# Patient Record
Sex: Male | Born: 1950
Health system: Southern US, Community
[De-identification: ages and names within clinical notes are randomized; demographics above are authoritative.]

## PROBLEM LIST (undated history)

## (undated) DIAGNOSIS — K469 Unspecified abdominal hernia without obstruction or gangrene: Secondary | ICD-10-CM

## (undated) DIAGNOSIS — R55 Syncope and collapse: Secondary | ICD-10-CM

## (undated) DIAGNOSIS — Z973 Presence of spectacles and contact lenses: Secondary | ICD-10-CM

## (undated) DIAGNOSIS — R001 Bradycardia, unspecified: Secondary | ICD-10-CM

## (undated) HISTORY — DX: Bradycardia, unspecified: R00.1

## (undated) HISTORY — DX: Unspecified abdominal hernia without obstruction or gangrene: K46.9

## (undated) HISTORY — PX: CHOLECYSTECTOMY: SHX55

## (undated) HISTORY — DX: Syncope and collapse: R55

## (undated) HISTORY — DX: Presence of spectacles and contact lenses: Z97.3

---

## 2010-07-05 ENCOUNTER — Inpatient Hospital Stay (HOSPITAL_COMMUNITY): Admission: EM | Admit: 2010-07-05 | Discharge: 2010-07-08 | Payer: Self-pay | Source: Home / Self Care

## 2010-07-05 LAB — COMPREHENSIVE METABOLIC PANEL
ALT: 23 U/L (ref 0–53)
AST: 26 U/L (ref 0–37)
Alkaline Phosphatase: 96 U/L (ref 39–117)
CO2: 29 mEq/L (ref 19–32)
Calcium: 9.1 mg/dL (ref 8.4–10.5)
Chloride: 100 mEq/L (ref 96–112)
GFR calc Af Amer: >60 mL/min (ref >60)
GFR calc non Af Amer: >60 mL/min (ref >60)
Glucose, Bld: 88 mg/dL (ref 70–99)
Potassium: 3.3 mEq/L — ABNORMAL LOW (ref 3.5–5.1)
Sodium: 139 mEq/L (ref 135–145)
Total Bilirubin: 1.4 mg/dL — ABNORMAL HIGH (ref 0.3–1.2)

## 2010-07-05 LAB — CBC
HCT: 49.1 % (ref 39.0–52.0)
Hemoglobin: 17.1 g/dL — ABNORMAL HIGH (ref 13.0–17.0)
MCHC: 34.8 g/dL (ref 30.0–36.0)
RBC: 5.62 MIL/uL (ref 4.22–5.81)

## 2010-07-06 LAB — COMPREHENSIVE METABOLIC PANEL
ALT: 44 U/L (ref 0–53)
Albumin: 2.4 g/dL — ABNORMAL LOW (ref 3.5–5.2)
Alkaline Phosphatase: 66 U/L (ref 39–117)
Calcium: 8 mg/dL — ABNORMAL LOW (ref 8.4–10.5)
Glucose, Bld: 102 mg/dL — ABNORMAL HIGH (ref 70–99)
Potassium: 4.1 mEq/L (ref 3.5–5.1)
Sodium: 139 mEq/L (ref 135–145)
Total Protein: 5.6 g/dL — ABNORMAL LOW (ref 6.0–8.3)

## 2010-07-06 LAB — CBC
HCT: 37.3 % — ABNORMAL LOW (ref 39.0–52.0)
MCHC: 34.3 g/dL (ref 30.0–36.0)
Platelets: 191 10*3/uL (ref 150–400)
RDW: 12.5 % (ref 11.5–15.5)
WBC: 6.8 10*3/uL (ref 4.0–10.5)

## 2010-07-08 LAB — CBC
HCT: 36.6 % — ABNORMAL LOW (ref 39.0–52.0)
MCH: 29.9 pg (ref 26.0–34.0)
MCV: 89.1 fL (ref 78.0–100.0)
Platelets: 208 10*3/uL (ref 150–400)
RBC: 4.11 MIL/uL — ABNORMAL LOW (ref 4.22–5.81)
RDW: 12.5 % (ref 11.5–15.5)
WBC: 4.4 10*3/uL (ref 4.0–10.5)

## 2010-07-08 LAB — COMPREHENSIVE METABOLIC PANEL
Alkaline Phosphatase: 66 U/L (ref 39–117)
BUN: 4 mg/dL — ABNORMAL LOW (ref 6–23)
Chloride: 108 mEq/L (ref 96–112)
Creatinine, Ser: 1.02 mg/dL (ref 0.4–1.5)
Glucose, Bld: 99 mg/dL (ref 70–99)
Potassium: 4.4 mEq/L (ref 3.5–5.1)
Total Bilirubin: 0.8 mg/dL (ref 0.3–1.2)

## 2010-07-08 NOTE — H&P (Signed)
Warren Brown, Warren Brown              ACCOUNT NO.:  [id number]  MEDICAL RECORD NO.:  [id number]          PATIENT TYPE:  INP  LOCATION:  0105                         FACILITY:  [facility]  PHYSICIAN:  Juanetta Gosling, MDDATE OF BIRTH:  1950/10/14  DATE OF ADMISSION:  07/05/2010 DATE OF DISCHARGE:                             HISTORY & PHYSICAL   PRIMARY CARE PHYSICIAN:  Robert L. Foy Guadalajara, M.D.  CHIEF COMPLAINTS:  Abdominal pain with anorexia.  HISTORY OF PRESENT ILLNESS:  Warren Brown is a healthy, 60 year old, white male who has had several episodes of right upper quadrant abdominal pain over the last several months.  Usually these episodes resolve after about an hour or so.  This past Sunday the patient developed right upper quadrant epigastric pain again.  He states this was severe in nature and radiated up into his chest.  At that time, he went to an urgent care doctor who found that the patient had a slightly elevated white blood cell count and a low grade fever but gave him a GI cocktail with the anticipation that this may be some reflux.  This did not help the patient.  He was given a dose of IM antibiotics at that time, however, I am unsure which antibiotics these are.  None of this seemed to help the patient.  Over the next several days, the patient continued with anorexia.  His pain dulled some; however, it persisted as far as abdominal bloating and discomfort.  He went to see his primary care physician who ordered an ultrasound of the abdomen yesterday.  This revealed a 2.3-cm gallstone lodged in the neck of the gallbladder and was immobile.  Otherwise no pericholecystic fluid or evidence of acute cholecystitis.  His primary care physician called our office today, and at that time, we sent the patient over to the emergency department for further evaluation pending possible cholecystectomy.  REVIEW OF SYSTEMS:  Please see HPI, otherwise all other systems have been reviewed and  are negative.  FAMILY HISTORY:  Noncontributory.  PAST MEDICAL HISTORY:  None.  PAST SURGICAL HISTORY:  None.  SOCIAL HISTORY:  The patient is married with four children and two of which are stepchildren.  He denies any tobacco; however, he admits to one to two beers a week.  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  ALLERGIES:  NKDA.  MEDICATIONS:  None.  PHYSICAL EXAM:  GENERAL:  Warren Brown is a very pleasant, 60 year old, white male who is currently sitting up in a chair in no acute distress. VITAL SIGNS:  Have currently not been obtained. HEENT: Head is normocephalic, atraumatic.  Sclerae noninjected.  Pupils are equal, round and reactive to light.  Ears and nose without any obvious masses or lesions.  No rhinorrhea.  Mouth is pink.  Throat shows no exudate. HEART:  Regular rate and rhythm.  Normal S1-S2.  No murmurs, gallops or rubs are noted.  He does have palpable carotid, radial and pedal pulses bilaterally. LUNGS:  Clear to auscultation bilaterally with no wheezes, rhonchi or rales noted.  Respiratory effort is nonlabored. ABDOMEN:  Soft but tender  in the epigastrium and right upper quadrant. He does otherwise have active bowel sounds and is mildly distended.  No obvious masses, or organomegaly are noted.  He does have a reducible umbilical hernia noted.  MUSCULOSKELETAL: All four extremities are symmetrical with no cyanosis, clubbing or edema. NEUROLOGIC:  Cranial nerves II-XII appear to be grossly intact.  Deep tendon reflex exam is deferred at this time. PSYCHIATRIC:  The patient is alert x3 with an appropriate affect.  LABS AND DIAGNOSTICS:  Labs are currently pending, CBC and CMET.  DIAGNOSTICS: An ultrasound of the abdomen and pelvis reveals cholelithiasis along with a 2.3-cm stone lodged in the neck of the gallbladder.  This is nonmobile.  No evidence of gallbladder wall thickening or pericholecystic fluid is noted.  Common  bile duct is normal caliber.  IMPRESSION:  Symptomatic cholelithiasis.  PLAN:  At this time, we will get the patient admitted.  We will check a stat CBC and CMET.  If for some reason the patient's LFTs are significantly elevated and there is possibility of choledocholithiasis, then we will get the patient admitted and recheck labs in the morning for possible GI consultation.  However, if his labs remained relatively normal, we will plan on proceeding with a laparoscopic cholecystectomy today.  The patient will be placed on IV fluids as well as IV antibiotics on-call to the OR and p.r.n. medications as needed for pain and nausea.     Letha Cape, PA   ______________________________ Juanetta Gosling, MD    KEO/MEDQ  D:  07/05/2010  T:  07/05/2010  Job:  [id number]  cc:   Molly Maduro L. Foy Guadalajara, M.D. Fax: [phone]  Electronically Signed by Barnetta Chapel PA on 07/08/2010 01:18:58 PM Electronically Signed by Emelia Loron MD on 07/08/2010 04:28:37 PM

## 2010-07-08 NOTE — Op Note (Signed)
Warren Brown, Warren Brown              ACCOUNT NO.:  [id number]  MEDICAL RECORD NO.:  [id number]          PATIENT TYPE:  INP  LOCATION:  1514                         FACILITY:  [facility]  PHYSICIAN:  Juanetta Gosling, MDDATE OF BIRTH:  December 07, 1950  DATE OF PROCEDURE:  07/05/2010 DATE OF DISCHARGE:                              OPERATIVE REPORT   PREOPERATIVE DIAGNOSIS:  Acute cholecystitis.  POSTOPERATIVE DIAGNOSIS:  Acute cholecystitis.  PROCEDURE:  Laparoscopic cholecystectomy.  SURGEON:  Juanetta Gosling, MD.  FIRST ASSISTANT:  Adolph Pollack, M.D.  ANESTHESIA:  General.  ANESTHESIOLOGIST:  Jill Side, M.D.  ESTIMATED BLOOD LOSS:  100 mL.  COMPLICATIONS:  None.  DRAINS:  A 9-French Blake drain to right upper quadrant.  COMPLICATIONS:  None.  DISPOSITION:  To recovery room in stable condition.  SPECIMENS:  Gallbladder contents to pathology.  SPECIMENS:  This is a 60 year old male who presents with about 5- to 6- day history of right upper quadrant and epigastric pain.  On his ultrasound, he had 2.3-cm gallbladder stone lodged in the neck of his gallbladder.  His bilirubin was only mildly elevated at 1.4.  The remainder of his LFTs are normal.  He had clinically had gallbladder disease both by history and by his examination.  He then discussed the laparoscopic cholecystectomy with risks and benefits associated with that.  His wife was present for this conversation as well.  DESCRIPTION OF PROCEDURE:  After informed consent was obtained, the patient was taken to the operating room.  He was administered 3 g of intravenous Unasyn and sequential compression devices were placed in lower extremities prior to induction of anesthesia.  He was then placed under general endotracheal anesthesia without complications.  His abdomen was prepped and draped in a standard sterile surgical fashion. Surgical time-out was then performed.  I infiltrated 0.25% Marcaine  below his umbilicus and made an 11-blade incision.  He had a small umbilical hernia, so was not to go through this and repair this and we can do this at a later date as I think he will get mesh for this but I would not repair this but mesh at this time.  I then entered his fascia with 11-blade.  The peritoneum was entered bluntly.  A 0 Vicryl pursestring suture was placed in the fascia.  A Hasson trocar was then introduced.  The abdomen was then insufflated to 15 mmHg pressure.  Three 4- to 5-mm ports were placed in the epigastrium and right upper quadrant.  The gallbladder was noted to be intrahepatic and had lot of adhesions to it from the omentum as well as the duodenum.  These were all taken down with a combination of blunt dissection and cautery.  The gallbladder also was aspirated in an attempt to be able to grasp but little bit easier, it was very thickened and edematous.  He clearly had had some chronic inflammation along with acute inflammation that he had.  This was a very difficult gallbladder to remove. Then eventually was able to push a lot of the inflammation down and was able to identify the triangle of Calot.  It took about  40 minutes dissecting the triangle of Calot.  Eventually, I was able to identify the cystic artery, the both anterior and posterior branches as well as the cystic duct.  I photographed and performed a cholangiogram but this cystic duct made me pretty sure that I did not want to do a cholangiogram on this.  I did obtain the critical view of safety however and I clipped the artery and clipped the duct and divided both of these. With some difficulty,  I removed the gallbladder off the liver bed. It was very adherent and there was no real plane between the 2. Eventually, I removed the gallbladder and I placed an EndoCatch bag and removed it from the umbilicus. I then obtained hemostasis after quite a while on his liver bed as it was bleeding a fair amount.   I also irrigated copiously.  I then placed a 19-French Harrison Mons drain and brought out it via my port sites.  This was secured with an 2-0 nylon.  I irrigated until this was clear.  I did place 2 pieces of Surgicel snow in his liver bed.  I then removed my umbilical trocar, washed this area, get tied down.  The umbilical defect was obliterated.  There is no evidence of any entry injury.  I then removed all the trocars, desufflated the abdomen, and I closed them with 4-0 Monocryl.  All the incisions then closed with 4-0  Monocryl and Dermabond was placed over these.  He tolerated this well and was transferred to recovery room in stable condition.     Juanetta Gosling, MD     MCW/MEDQ  D:  07/05/2010  T:  07/05/2010  Job:  [id number]  Electronically Signed by Emelia Loron MD on 07/08/2010 11:31:34 AM

## 2010-07-19 NOTE — Discharge Summary (Signed)
Warren Brown, Warren Brown              ACCOUNT NO.:  [id number]  MEDICAL RECORD NO.:  [id number]          PATIENT TYPE:  INP  LOCATION:  1514                         FACILITY:  [facility]  PHYSICIAN:  Wilmon Arms. Corliss Skains, M.D. DATE OF BIRTH:  1951-01-21  DATE OF ADMISSION:  07/05/2010 DATE OF DISCHARGE:  07/08/2010                              DISCHARGE SUMMARY   PRIMARY CARE PHYSICIAN:  Robert L. Foy Guadalajara, M.D.  CONSULTANTS:  None.  PROCEDURES:  Laparoscopic cholecystectomy by Dr. Dwain Sarna on July 05, 2010.  REASON FOR ADMISSION:  Warren Brown is a healthy 60 year old male who has had several prior episodes of right upper quadrant abdominal pain. About a week prior to admission, he began having severe right upper quadrant and epigastric and chest pain.  This eventually subsided, but continued to have a dull pain in the right upper quadrant as well as bloatedness.  He had had some anorexia.  He eventually had an ultrasound, which revealed a 2.3-cm gallstone lodged in the neck of the gallbladder.  Due to continued anorexia and discomfort, he presented to the emergency department.  We were asked to see him for surgical admission.  ADMITTING DIAGNOSIS:  Symptomatic cholelithiasis.  HOSPITAL COURSE:  At this time, the patient was admitted.  He was placed on IV fluids.  He was given a dose of Unasyn on call to the operating room.  He was later taken to the operating room, where he was found to have acute cholecystitis.  A laparoscopic cholecystectomy was completed. However, due to a significant amount of inflammation, a 19-French Blake drain was placed in the right upper quadrant.  Postoperatively, the patient did fairly well.  On the first postoperative day, the patient had some issues with urinary retention.  Therefore, he had several I and O caths completed.  He continued to have some abdominal pain on postoperative day #1 as well as decreased appetite.  He was left on clear liquids.  On  postoperative day #2, his diet was advanced to a regular diet.  His JP drain was only putting out serosanguineous stuff. On postoperative day #3, the patient's pain was much improved.  His abdomen was soft and minimally tender.  His JP drain continued to remain serosanguineous and without any evidence of bile; therefore, this was discontinued prior to discharge home.  At this time, the patient was felt stable for discharge home.  DISCHARGE DIAGNOSES: 1. Acute cholecystitis with cholelithiasis. 2. Status post laparoscopic cholecystectomy. 3. Reducible umbilical hernia. 4. Urinary retention, resolved.  DISCHARGE MEDICATIONS:  Please see medication reconciliation form.  DISCHARGE INSTRUCTIONS:  The patient may increase his activities slowly and walk up steps.  He may shower; however, he is not to bathe.  He is not to do any heavy lifting over 15 pounds for the next 2 weeks.  He is not to drive up to 1 week depending on how he feels.  He has no dietary restrictions.  He is to return to see Dr. Emelia Loron in our office in 2-3 weeks to also discuss elective umbilical hernia repair as well.  He is to otherwise call our office for fever greater  than 101.5 or worsening abdominal pain.     Letha Cape, PA   ______________________________ Wilmon Arms. Corliss Skains, M.D.    KEO/MEDQ  D:  07/08/2010  T:  07/08/2010  Job:  [id number]  cc:   Molly Maduro L. Foy Guadalajara, M.D. Fax: [phone]  Juanetta Gosling, MD [address] [place] [address]  Electronically Signed by Barnetta Chapel PA on 07/17/2010 03:29:31 PM Electronically Signed by Manus Rudd M.D. on 07/19/2010 04:08:45 PM

## 2010-11-18 ENCOUNTER — Ambulatory Visit (HOSPITAL_COMMUNITY)
Admission: RE | Admit: 2010-11-18 | Discharge: 2010-11-18 | Disposition: A | Discharge status: Home / Self Care | Payer: BC Managed Care – PPO | Source: Ambulatory Visit | Attending: General Surgery | Admitting: General Surgery

## 2010-11-18 ENCOUNTER — Other Ambulatory Visit (INDEPENDENT_AMBULATORY_CARE_PROVIDER_SITE_OTHER): Payer: Self-pay | Admitting: General Surgery

## 2010-11-18 ENCOUNTER — Encounter (HOSPITAL_COMMUNITY): Payer: BC Managed Care – PPO

## 2010-11-18 DIAGNOSIS — Z01818 Encounter for other preprocedural examination: Secondary | ICD-10-CM

## 2010-11-18 DIAGNOSIS — K439 Ventral hernia without obstruction or gangrene: Secondary | ICD-10-CM | POA: Insufficient documentation

## 2010-11-18 DIAGNOSIS — Z01812 Encounter for preprocedural laboratory examination: Secondary | ICD-10-CM | POA: Insufficient documentation

## 2010-11-18 LAB — DIFFERENTIAL
Basophils Absolute: 0 10*3/uL (ref 0.0–0.1)
Basophils Relative: 0 % (ref 0–1)
Lymphocytes Relative: 17 % (ref 12–46)
Neutro Abs: 4.3 10*3/uL (ref 1.7–7.7)
Neutrophils Relative %: 73 % (ref 43–77)

## 2010-11-18 LAB — CBC
HCT: 45.1 % (ref 39.0–52.0)
Hemoglobin: 15 g/dL (ref 13.0–17.0)
RBC: 5.16 MIL/uL (ref 4.22–5.81)
WBC: 5.9 10*3/uL (ref 4.0–10.5)

## 2010-11-18 LAB — BASIC METABOLIC PANEL
CO2: 28 mEq/L (ref 19–32)
Chloride: 107 mEq/L (ref 96–112)
GFR calc non Af Amer: >60 mL/min (ref >60)
Glucose, Bld: 86 mg/dL (ref 70–99)
Potassium: 3.9 mEq/L (ref 3.5–5.1)
Sodium: 142 mEq/L (ref 135–145)

## 2010-11-18 LAB — SURGICAL PCR SCREEN: Staphylococcus aureus: POSITIVE — AB

## 2010-11-29 ENCOUNTER — Observation Stay (HOSPITAL_COMMUNITY)
Admission: RE | Admit: 2010-11-29 | Discharge: 2010-11-30 | DRG: 160 | Disposition: A | Discharge status: Home / Self Care | Payer: BC Managed Care – PPO | Source: Ambulatory Visit | Attending: General Surgery | Admitting: General Surgery

## 2010-11-29 DIAGNOSIS — Z9089 Acquired absence of other organs: Secondary | ICD-10-CM | POA: Insufficient documentation

## 2010-11-29 DIAGNOSIS — Z01818 Encounter for other preprocedural examination: Secondary | ICD-10-CM | POA: Insufficient documentation

## 2010-11-29 DIAGNOSIS — K429 Umbilical hernia without obstruction or gangrene: Principal | ICD-10-CM | POA: Insufficient documentation

## 2010-11-29 DIAGNOSIS — Z01812 Encounter for preprocedural laboratory examination: Secondary | ICD-10-CM | POA: Insufficient documentation

## 2010-11-29 HISTORY — PX: LAPAROSCOPIC INCISIONAL / UMBILICAL / VENTRAL HERNIA REPAIR: SUR789

## 2010-11-29 NOTE — Op Note (Signed)
NAMEOTHMAR, RINGER NO.:  [id number]  MEDICAL RECORD NO.:  [id number]  LOCATION:  DAYL                         FACILITY:  [facility]  PHYSICIAN:  Juanetta Gosling, MDDATE OF BIRTH:  30-Dec-1950  DATE OF PROCEDURE: DATE OF DISCHARGE:                              OPERATIVE REPORT   PREOPERATIVE DIAGNOSIS:  Umbilical hernia.  POSTOPERATIVE DIAGNOSIS:  Umbilical hernia.  PROCEDURE:  Laparoscopic ventral hernia repair with 10 x 15 cm Physiomesh.  SURGEON:  Juanetta Gosling, MD  ASSISTANT:  None.  ANESTHESIA:  General.  SUPERVISING ANESTHESIOLOGIST:  Hezzie Bump. Okey Dupre, M.D.  SPECIMENS:  None.  DRAINS:  None.  COMPLICATIONS:  None.  ESTIMATED BLOOD LOSS:  Minimal.  DISPOSITION:  Recovery room in stable condition.  INDICATIONS:  This is a 60 year old male who I operated on for a gangrenous gallbladder on July 05, 2010 which he did well from, except for some postoperative urinary retention.  He had an umbilical hernia at that time but I elected not to repair this because it was a size that needed mesh to repair and I told him that we would repair this at a later date.  He came back and to see me this area.  He still had about a 1.5 to 2-cm hernia.  I discussed with him the options.  I think that the best option given the fact that he has a laparoscopic incision right around that area as well would be to do a laparoscopic repair and cover this hernia as well as that area with mesh to reduce the long-term risk of recurrence.  He and I did discuss the risks at length, the risk and benefits associated with them and decided to proceed.  PROCEDURE:  After informed consent was obtained, the patient was taken to the operating room.  He was administered 1 g of intravenous cefazolin.  Sequential compression devices were placed on lower extremities prior to induction with anesthesia.  He was then placed under general anesthesia without complication.  A Foley  catheter was placed.  His abdomen was prepped and draped in standard sterile surgical fashion.  His arms were tucked and appropriately padded.  Collier Flowers was placed over his abdomen and surgical time-out was then performed.  An orogastric tube was placed and the stomach was evacuated.  I then infiltrated 0.25% Marcaine in his left upper quadrant and made an incision with an 11 blade.  I then entered his abdomen without any difficulty with a 5-mm OptiVu trocar.  The abdomen was then insufflated to 15 mmHg pressure.  I then inspected for an entry injury, of which there was none.  He had about a 2-cm umbilical hernia.  With the laparoscopic incision just below that, I then placed a 10-mm trocar in his left midabdomen as well as left lower quadrant as well as one on the right side with another 5-mm trocar.  I then used a 10 x 15 cm mesh after attaching 0 Prolene sutures to the 4 cardinal positions, I inserted this through the 10-mm trocar.  I then laid this flat.  I got about 5-cm coverage in all directions with this mesh and went below his laparoscopic incision as well.  I pulled these up at the [facility] device and secured this with the Ethicon secure strap tacker as well. This mesh was in good position and gave good coverage to the hernia throughout.  There was no evidence of any injury anywhere after inspecting the bowel.  I then pulled the Hasson trocar out.  I closed this with a 0 Vicryl suture in the Endoclose device and then desufflated the abdomen and removed all trocars.  The incisions were closed with 4-0 Monocryl and Dermabond and abdominal binder was placed.  He tolerated this well.  His Foley catheter is going to remain overnight due to his history of urinary retention.  He was extubated in the operating room and transferred to recovery room in stable condition.  Sponge and needle counts were correct at completion of operation.     Juanetta Gosling, MD     MCW/MEDQ  D:   11/29/2010  T:  11/29/2010  Job:  [id number]  cc:   Molly Maduro L. Foy Guadalajara, M.D. Fax: [phone]  Electronically Signed by Emelia Loron MD on 11/29/2010 03:42:41 PM

## 2010-12-13 ENCOUNTER — Encounter (INDEPENDENT_AMBULATORY_CARE_PROVIDER_SITE_OTHER): Payer: Self-pay | Admitting: General Surgery

## 2010-12-16 ENCOUNTER — Encounter (INDEPENDENT_AMBULATORY_CARE_PROVIDER_SITE_OTHER): Payer: BC Managed Care – PPO | Admitting: General Surgery

## 2010-12-21 NOTE — Discharge Summary (Signed)
  NAMEELBERT, Warren Brown              ACCOUNT NO.:  [id number]  MEDICAL RECORD NO.:  [id number]  LOCATION:  1537                         FACILITY:  [facility]  PHYSICIAN:  Juanetta Gosling, MDDATE OF BIRTH:  1950/10/21  DATE OF ADMISSION:  11/29/2010 DATE OF DISCHARGE:  11/30/2010                              DISCHARGE SUMMARY   ADMISSION DIAGNOSES: 1. Umbilical hernia. 2. Status post lap chole.  POSTOPERATIVE DIAGNOSES: 1. Umbilical hernia. 2. Status post lap chole.  PROCEDURES PERFORMED:  November 29, 2010, laparoscopic ventral hernia repair with 10 x 15 cm Physiomesh.  CONSULTATIONS:  None.  HISTORY AND HOSPITAL COURSE:  This is a 61 year old male I did a laparoscopic cholecystectomy on July 05, 2010, for acute cholecystitis.  He had an umbilical hernia at that time, it was a size where he needed to have mesh placed, so I did not address it at the same time due to the fact that he had infected gallbladder.  He then returned to see me for a symptomatic umbilical hernia.  We discussed all the options and decided on the laparoscopic repair.  He underwent this uneventfully on November 29, 2010.  Postoperative day #1, he was on oral pain medications, had Foley removed, was voiding, was tolerating regular diet and was discharged to home.  PERTINENT LABORATORY EVALUATION:  None.  PERTINENT RADIOLOGIC INFORMATION:  None.  FOLLOW-UP:  With Dr. Dwain Sarna in 3 weeks.  DISCHARGE INSTRUCTIONS:  Restrictions were no heavy lifting over 10 to 15 pounds until he returns to see me.     Juanetta Gosling, MD     MCW/MEDQ  D:  12/20/2010  T:  12/21/2010  Job:  [id number]  Electronically Signed by Emelia Loron MD on 12/21/2010 06:57:42 PM

## 2011-01-13 ENCOUNTER — Ambulatory Visit (INDEPENDENT_AMBULATORY_CARE_PROVIDER_SITE_OTHER): Payer: BC Managed Care – PPO | Admitting: General Surgery

## 2011-01-13 ENCOUNTER — Encounter (INDEPENDENT_AMBULATORY_CARE_PROVIDER_SITE_OTHER): Payer: Self-pay | Admitting: General Surgery

## 2011-01-13 DIAGNOSIS — K439 Ventral hernia without obstruction or gangrene: Secondary | ICD-10-CM | POA: Insufficient documentation

## 2011-01-13 DIAGNOSIS — Z09 Encounter for follow-up examination after completed treatment for conditions other than malignant neoplasm: Secondary | ICD-10-CM

## 2011-01-13 NOTE — Progress Notes (Signed)
Subjective:     Patient ID: Warren Brown, male   DOB: [birth year], 60 y.o.   MRN: [id number]  HPI This is a 60 year old male who I know from a laparoscopic cholecystectomy for acute cholecystitis. He had an umbilical hernia at that time that I did not repair. She came back and we ended up performing a laparoscopic ventral hernia repair with physiomesh. He stayed in the hospital overnight and was then discharged home. He's done well overall. He had some issues with constipation that are now resolved. He also complains of some intermittent left lower quadrant pain at his port sites as well as his suture sites it is improving over some time now.  Review of Systems     Objective:   Physical Exam Abdomen soft, nontender, incisions all clean without infection, no seroma     Assessment:     S/p LVH repair    Plan:        I released to full activity today and asked him to come back and see me as needed.  I told him he could begin lifting weights exercising slowly and workup back to his normal state that he was in prior to the operation.

## 2014-10-30 DIAGNOSIS — Z0000 Encounter for general adult medical examination without abnormal findings: Secondary | ICD-10-CM | POA: Diagnosis not present

## 2014-10-30 DIAGNOSIS — R748 Abnormal levels of other serum enzymes: Secondary | ICD-10-CM | POA: Diagnosis not present

## 2014-10-30 DIAGNOSIS — Z23 Encounter for immunization: Secondary | ICD-10-CM | POA: Diagnosis not present

## 2014-10-30 DIAGNOSIS — Z136 Encounter for screening for cardiovascular disorders: Secondary | ICD-10-CM | POA: Diagnosis not present

## 2014-10-30 DIAGNOSIS — Z125 Encounter for screening for malignant neoplasm of prostate: Secondary | ICD-10-CM | POA: Diagnosis not present

## 2015-01-14 DIAGNOSIS — Z743 Need for continuous supervision: Secondary | ICD-10-CM | POA: Diagnosis not present

## 2015-01-14 DIAGNOSIS — R55 Syncope and collapse: Secondary | ICD-10-CM | POA: Diagnosis not present

## 2015-01-15 DIAGNOSIS — R55 Syncope and collapse: Secondary | ICD-10-CM | POA: Diagnosis not present

## 2015-01-15 DIAGNOSIS — R079 Chest pain, unspecified: Secondary | ICD-10-CM | POA: Diagnosis not present

## 2015-01-17 DIAGNOSIS — R030 Elevated blood-pressure reading, without diagnosis of hypertension: Secondary | ICD-10-CM | POA: Diagnosis not present

## 2015-01-17 DIAGNOSIS — R55 Syncope and collapse: Secondary | ICD-10-CM | POA: Diagnosis not present

## 2015-01-17 DIAGNOSIS — R944 Abnormal results of kidney function studies: Secondary | ICD-10-CM | POA: Diagnosis not present

## 2015-01-23 ENCOUNTER — Ambulatory Visit (INDEPENDENT_AMBULATORY_CARE_PROVIDER_SITE_OTHER): Payer: Medicare Other | Admitting: Cardiovascular Disease

## 2015-01-23 ENCOUNTER — Encounter: Payer: Self-pay | Admitting: Cardiovascular Disease

## 2015-01-23 VITALS — BP 138/90 | HR 54 | Ht 70.0 in | Wt 172.0 lb

## 2015-01-23 DIAGNOSIS — R55 Syncope and collapse: Secondary | ICD-10-CM

## 2015-01-23 NOTE — Progress Notes (Signed)
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 He was referred by Warren Brown at [facility] for cardiovascular evaluation because of a recent episode of syncope. He has no coronary vascular risk factors. He had his presyncopal episodes in quick succession in [place] on 01/15/15. This occurred after driving 12 hours from [place] to [place] without food or water. He did drink 4 beers that evening and had 3 sequential episodes of syncope. There is no loss of bowel or bladder control. When EMS arrived his blood pressure was noted to be 90/50. Recent blood work performed 2 days afterwards revealed a serum creatinine 1.75 which was significantly higher than his baseline of 1. He denies chest pain or shortness of breath.   No current outpatient prescriptions on file.   No current facility-administered medications for this visit.    Allergies  Allergen Reactions  . Neosporin [Neomycin-Polymyxin-Gramicidin] Rash    Social History   Social History  . Marital Status: Married    Spouse Name: N/A  . Number of Children: N/A  . Years of Education: N/A   Occupational History  . Not on file.   Social History Main Topics  . Smoking status: Never Smoker   . Smokeless tobacco: Not on file  . Alcohol Use: 0.5 oz/week    1 drink(s) per week  . Drug Use: Not on file  . Sexual Activity: Not on file   Other Topics Concern  . Not on file   Social History Narrative     Review of Systems: General: negative for chills, fever, night sweats or weight changes.  Cardiovascular: negative for chest pain, dyspnea on exertion, edema, orthopnea, palpitations, paroxysmal nocturnal dyspnea or  shortness of breath Dermatological: negative for rash Respiratory: negative for cough or wheezing Urologic: negative for hematuria Abdominal: negative for nausea, vomiting, diarrhea, bright red blood per rectum, melena, or hematemesis Neurologic: negative for visual changes, syncope, or dizziness All other systems reviewed and are otherwise negative except as noted above.    Blood pressure 138/90, pulse 54, height  (1.778 m), weight 172 lb (78.019 kg).  General appearance: alert and no distress Neck: no adenopathy, no carotid bruit, no JVD, supple, symmetrical, trachea midline and thyroid not enlarged, symmetric, no tenderness/mass/nodules Lungs: clear to auscultation bilaterally Heart: regular rate and rhythm, S1, S2 normal, no murmur, click, rub or gallop Extremities: extremities normal, atraumatic, no cyanosis or edema  EKG sinus bradycardia at 54 without ST or T-wave changes. I personally reviewed this EKG  ASSESSMENT AND PLAN:   Syncope Warren Brown was  referred for evaluation of syncope. He is a 64 year old fit-appearing married Caucasian male with no critically factors who drove from [place] to [place] where he was visiting his daughter and son for 12 hours. During the trip he did not eat or drink anything. That night he drank 4 beers and had 3 syncopal episodes. When EMS arrived his blood pressure was 90/50. This occurred on 01/15/15. His most recent lab work on 01/17/15 showed an increase in his creatinine from 1-1.75. He denies chest pain or shortness of breath. He never had an episode like this before. His EKG just does show sinus bradycardia rate of 54. I suspect his  increasing creatinine was from ATN related to dehydration and I suspect this will resolve. I'm going to get a 2 week event monitor to assess his rate and rhythm. I will see him back in 3 months for follow-up.      Warren Gess MD FACP,FACC,FAHA, [facility] 01/23/2015 4:25 PM

## 2015-01-23 NOTE — Assessment & Plan Note (Signed)
Mr. Mohl was  referred for evaluation of syncope. He is a 64 year old fit-appearing married Caucasian male with no critically factors who drove from [place] to [place] where he was visiting his daughter and son for 12 hours. During the trip he did not eat or drink anything. That night he drank 4 beers and had 3 syncopal episodes. When EMS arrived his blood pressure was 90/50. This occurred on 01/15/15. His most recent lab work on 01/17/15 showed an increase in his creatinine from 1-1.75. He denies chest pain or shortness of breath. He never had an episode like this before. His EKG just does show sinus bradycardia rate of 54. I suspect his increasing creatinine was from ATN related to dehydration and I suspect this will resolve. I'm going to get a 2 week event monitor to assess his rate and rhythm. I will see him back in 3 months for follow-up.

## 2015-01-23 NOTE — Patient Instructions (Signed)
Dr Allyson Sabal recommends that you schedule a follow-up appointment in 3 months.  Your Doctor has ordered you to wear a heart monitor. You will wear this for 2 weeks.   TIPS -  REMINDERS 1. The sensor is the lanyard that is worn around your neck every day - this is powered by a battery that needs to be changed every day 2. The monitor is the device that allows you to record symptoms - this will need to be charged daily 3. The sensor & monitor need to be within 100 feet of each other at all times 4. The sensor connects to the electrodes (stickers) - these should be changed every 24-48 hours (you do not have to remove them when you bathe, just make sure they are dry when you connect it back to the sensor 5. If you need more supplies (electrodes, batteries), please call the 1-800 # on the back of the pamphlet and [employer] will mail you more supplies 6. If your skin becomes sensitive, please try the sample pack of sensitive skin electrodes (the white packet in your silver box) and call [employer] to have them mail you more of these type of electrodes 7. When you are finish wearing the monitor, please place all supplies back in the silver box, place the silver box in the pre-packaged UPS bag and drop off at [employer] or call them so they can come pick it up   Cardiac Event Monitoring A cardiac event monitor is a small recording device used to help detect abnormal heart rhythms (arrhythmias). The monitor is used to record heart rhythm when noticeable symptoms such as the following occur:  Fast heartbeats (palpitations), such as heart racing or fluttering.  Dizziness.  Fainting or light-headedness.  Unexplained weakness. The monitor is wired to two electrodes placed on your chest. Electrodes are flat, sticky disks that attach to your skin. The monitor can be worn for up to 30 days. You will wear the monitor at all times, except when bathing.  HOW TO USE YOUR CARDIAC EVENT MONITOR A technician will prepare  your chest for the electrode placement. The technician will show you how to place the electrodes, how to work the monitor, and how to replace the batteries. Take time to practice using the monitor before you leave the office. Make sure you understand how to send the information from the monitor to your health care provider. This requires a telephone with a landline, not a cell phone. You need to:  Wear your monitor at all times, except when you are in water:  Do not get the monitor wet.  Take the monitor off when bathing. Do not swim or use a hot tub with it on.  Keep your skin clean. Do not put body lotion or moisturizer on your chest.  Change the electrodes daily or any time they stop sticking to your skin. You might need to use tape to keep them on.  It is possible that your skin under the electrodes could become irritated. To keep this from happening, try to put the electrodes in slightly different places on your chest. However, they must remain in the area under your left breast and in the upper right section of your chest.  Make sure the monitor is safely clipped to your clothing or in a location close to your body that your health care provider recommends.  Press the button to record when you feel symptoms of heart trouble, such as dizziness, weakness, light-headedness, palpitations, thumping, shortness of  breath, unexplained weakness, or a fluttering or racing heart. The monitor is always on and records what happened slightly before you pressed the button, so do not worry about being too late to get good information.  Keep a diary of your activities, such as walking, doing chores, and taking medicine. It is especially important to note what you were doing when you pushed the button to record your symptoms. This will help your health care provider determine what might be contributing to your symptoms. The information stored in your monitor will be reviewed by your health care provider  alongside your diary entries.  Send the recorded information as recommended by your health care provider. It is important to understand that it will take some time for your health care provider to process the results.  Change the batteries as recommended by your health care provider. SEEK IMMEDIATE MEDICAL CARE IF:   You have chest pain.  You have extreme difficulty breathing or shortness of breath.  You develop a very fast heartbeat that persists.  You develop dizziness that does not go away.  You faint or constantly feel you are about to faint. Document Released: 03/04/2008 Document Revised: 10/10/2013 Document Reviewed: 11/22/2012 [facility] Patient Information 2015 [place], [place]. This information is not intended to replace advice given to you by your health care provider. Make sure you discuss any questions you have with your health care provider.

## 2015-01-25 ENCOUNTER — Encounter (INDEPENDENT_AMBULATORY_CARE_PROVIDER_SITE_OTHER): Payer: Medicare Other

## 2015-01-25 DIAGNOSIS — R55 Syncope and collapse: Secondary | ICD-10-CM | POA: Diagnosis not present

## 2015-01-31 ENCOUNTER — Telehealth: Payer: Self-pay | Admitting: Cardiovascular Disease

## 2015-01-31 NOTE — Telephone Encounter (Signed)
Patient states that he had a problem with his monitor and the company will have a new one out in 2 days.  He wanted to ask Dr. Allyson Sabal if it was really necessary for him to wear a monitor for another week?  Could he look what he has at what his monitor has done for the past week?  Also wants to let Dr. Allyson Sabal know that his blood pressure has stabilized this week.  8/23: 125/83  8/24: 120/79  Wanted to let Dr. Allyson Sabal know that he feels pretty good

## 2015-01-31 NOTE — Telephone Encounter (Signed)
Warren Brown is calling about his heat monitor . He has some questions about it . Please call   Thanks

## 2015-02-02 NOTE — Telephone Encounter (Signed)
Spoke to caller & advised to continue to wear monitor. Pt voiced opposition to this and wants to discontune wear. 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  Advised if insistent to discontinue, contact [employer] so they can arrange - reiterated recommendation to continue. Pt voiced understanding.

## 2015-02-02 NOTE — Telephone Encounter (Signed)
Cell phone goes directly to VM. LM for patient to return call.

## 2015-02-02 NOTE — Telephone Encounter (Signed)
Patient is still waiting to hear from [facility]

## 2015-02-02 NOTE — Telephone Encounter (Signed)
Pt called in stating that he is returning the nurse's call

## 2015-02-02 NOTE — Telephone Encounter (Signed)
Phone still goes directly to voicemail. Left msg for patient to call, gave desk extension to reach me.

## 2015-02-19 DIAGNOSIS — R944 Abnormal results of kidney function studies: Secondary | ICD-10-CM | POA: Diagnosis not present

## 2015-02-19 DIAGNOSIS — R55 Syncope and collapse: Secondary | ICD-10-CM | POA: Diagnosis not present

## 2015-03-07 ENCOUNTER — Ambulatory Visit: Payer: BC Managed Care – PPO | Admitting: Cardiology

## 2015-03-12 DIAGNOSIS — Z23 Encounter for immunization: Secondary | ICD-10-CM | POA: Diagnosis not present

## 2015-03-12 DIAGNOSIS — R001 Bradycardia, unspecified: Secondary | ICD-10-CM | POA: Diagnosis not present

## 2015-03-12 DIAGNOSIS — I471 Supraventricular tachycardia: Secondary | ICD-10-CM | POA: Diagnosis not present

## 2015-04-25 ENCOUNTER — Ambulatory Visit: Payer: Medicare Other | Admitting: Cardiovascular Disease

## 2016-09-16 ENCOUNTER — Emergency Department (HOSPITAL_COMMUNITY): Payer: Medicare Other

## 2016-09-16 ENCOUNTER — Encounter (HOSPITAL_COMMUNITY): Payer: Self-pay | Admitting: Emergency Medicine

## 2016-09-16 ENCOUNTER — Emergency Department (HOSPITAL_COMMUNITY)
Admission: EM | Admit: 2016-09-16 | Discharge: 2016-09-16 | Disposition: A | Discharge status: Home / Self Care | Payer: Medicare Other | Attending: Emergency Medicine | Admitting: Emergency Medicine

## 2016-09-16 DIAGNOSIS — R55 Syncope and collapse: Secondary | ICD-10-CM

## 2016-09-16 DIAGNOSIS — R001 Bradycardia, unspecified: Secondary | ICD-10-CM | POA: Insufficient documentation

## 2016-09-16 DIAGNOSIS — R112 Nausea with vomiting, unspecified: Secondary | ICD-10-CM | POA: Diagnosis not present

## 2016-09-16 LAB — BASIC METABOLIC PANEL
Anion gap: 10 (ref 5–15)
BUN: 11 mg/dL (ref 6–20)
CALCIUM: 8.7 mg/dL — AB (ref 8.9–10.3)
CO2: 26 mmol/L (ref 22–32)
CREATININE: 1.05 mg/dL (ref 0.61–1.24)
Chloride: 104 mmol/L (ref 101–111)
GFR calc Af Amer: >60 mL/min (ref >60)
GLUCOSE: 121 mg/dL — AB (ref 65–99)
POTASSIUM: 3.6 mmol/L (ref 3.5–5.1)
Sodium: 140 mmol/L (ref 135–145)

## 2016-09-16 LAB — CBC
HCT: 46.4 % (ref 39.0–52.0)
Hemoglobin: 15.7 g/dL (ref 13.0–17.0)
MCH: 30.8 pg (ref 26.0–34.0)
MCHC: 33.8 g/dL (ref 30.0–36.0)
MCV: 91.2 fL (ref 78.0–100.0)
PLATELETS: 170 10*3/uL (ref 150–400)
RBC: 5.09 MIL/uL (ref 4.22–5.81)
RDW: 12.9 % (ref 11.5–15.5)
WBC: 12.1 10*3/uL — AB (ref 4.0–10.5)

## 2016-09-16 LAB — MAGNESIUM: MAGNESIUM: 2.1 mg/dL (ref 1.7–2.4)

## 2016-09-16 LAB — CBG MONITORING, ED: GLUCOSE-CAPILLARY: 125 mg/dL — AB (ref 65–99)

## 2016-09-16 LAB — I-STAT TROPONIN, ED: TROPONIN I, POC: 0 ng/mL (ref 0.00–0.08)

## 2016-09-16 MED ORDER — SODIUM CHLORIDE 0.9 % IV BOLUS (SEPSIS)
1000.0000 mL | Freq: Once | INTRAVENOUS | Status: AC
Start: 2016-09-16 — End: 2016-09-16
  Administered 2016-09-16: 1000 mL via INTRAVENOUS

## 2016-09-16 MED ORDER — ONDANSETRON 4 MG PO TBDP: 4.0000 mg | ORAL_TABLET | Freq: Three times a day (TID) | ORAL | 0 refills | Status: DC | PRN

## 2016-09-16 MED ORDER — ONDANSETRON HCL 4 MG/2ML IJ SOLN
INTRAMUSCULAR | Status: AC
  Filled 2016-09-16: qty 2

## 2016-09-16 MED ORDER — ONDANSETRON HCL 4 MG/2ML IJ SOLN: 4.0000 mg | Freq: Once | INTRAMUSCULAR | Status: DC

## 2016-09-16 NOTE — ED Triage Notes (Signed)
Pt arrives via EMS from parking lot for syncopal episode, pt has no recollection of event. States nauseated at this time, states "I felt pretty strange" and nausea p/t episode. Hx bradycardia. Wife at bedside reports he hit his head but did not witness event.

## 2016-09-16 NOTE — ED Notes (Signed)
Pt vomited large amount, but continues to refuse Zofran.  Pt st's he is fine now and is ready to go home.  Advised pt and explained that he needed to stay in the hospital

## 2016-09-16 NOTE — ED Provider Notes (Signed)
[facility]-EMERGENCY DEPT Provider Note   CSN: [id number] Arrival date & time: 09/16/16  1950     History   Chief Complaint Chief Complaint  Patient presents with  . Loss of Consciousness    HPI Warren Brown is a 66 y.o. male.  The history is provided by the patient.  Loss of Consciousness   This is a new problem. The current episode started less than 1 hour ago. The problem occurs constantly. The problem has been resolved. He lost consciousness for a period of less than one minute. The problem is associated with normal activity. Associated symptoms include light-headedness and nausea. Pertinent negatives include abdominal pain, back pain, bladder incontinence, bowel incontinence, chest pain, clumsiness, confusion, congestion, diaphoresis, dizziness, fever, focal sensory loss, focal weakness, headaches, malaise/fatigue, palpitations, seizures, slurred speech, vertigo, visual change, vomiting and weakness. He has tried nothing for the symptoms. His past medical history does not include CAD, CVA, HTN or seizures.    Past Medical History:  Diagnosis Date  . Hernia   . Sinus bradycardia   . Syncope   . Wears glasses     Patient Active Problem List   Diagnosis Date Noted  . Syncope 01/23/2015  . Ventral hernia 01/13/2011    Past Surgical History:  Procedure Laterality Date  . CHOLECYSTECTOMY    . LAPAROSCOPIC INCISIONAL / UMBILICAL / VENTRAL HERNIA REPAIR  11/29/10       Home Medications    Prior to Admission medications   Medication Sig Start Date End Date Taking? Authorizing Provider  ondansetron (ZOFRAN ODT) 4 MG disintegrating tablet Take 1 tablet (4 mg total) by mouth every 8 (eight) hours as needed for nausea or vomiting. 09/16/16   Stacy Gardner, MD    Family History Family History  Problem Relation Age of Onset  . Hypertension Mother     Social History Social History  Substance Use Topics  . Smoking status: Never Smoker  . Smokeless tobacco: Never Used  .  Alcohol use 0.5 oz/week    1 Standard drinks or equivalent per week     Allergies   Neosporin [neomycin-polymyxin-gramicidin]   Review of Systems Review of Systems  Constitutional: Negative for chills, diaphoresis, fever and malaise/fatigue.  HENT: Negative for congestion, ear pain and sore throat.   Eyes: Negative for pain and visual disturbance.  Respiratory: Negative for cough and shortness of breath.   Cardiovascular: Positive for syncope. Negative for chest pain and palpitations.  Gastrointestinal: Positive for nausea. Negative for abdominal pain, bowel incontinence and vomiting.  Genitourinary: Negative for bladder incontinence, dysuria and hematuria.  Musculoskeletal: Negative for arthralgias and back pain.  Skin: Negative for color change and rash.  Neurological: Positive for syncope and light-headedness. Negative for dizziness, vertigo, focal weakness, seizures, weakness and headaches.  Psychiatric/Behavioral: Negative for confusion.  All other systems reviewed and are negative.    Physical Exam Updated Vital Signs BP 132/75   Pulse (!) 52   Temp 97.8 F (36.6 C) (Oral)   Resp (!) 9   Ht  (1.778 m)   Wt 79.4 kg   SpO2 93%   BMI 25.11 kg/m   Physical Exam  Constitutional: He appears well-developed and well-nourished.  HENT:  Head: Normocephalic and atraumatic.  Mouth/Throat: Mucous membranes are normal.  Eyes: Conjunctivae and EOM are normal. Pupils are equal, round, and reactive to light.  Neck: Neck supple.  Cardiovascular: Regular rhythm and intact distal pulses.  Bradycardia present.   No murmur heard. Pulmonary/Chest: Effort normal and breath  sounds normal. No respiratory distress.  Abdominal: Soft. There is no tenderness.  Musculoskeletal: He exhibits no edema.  Neurological: He is alert. He has normal strength. No cranial nerve deficit or sensory deficit. Coordination normal. GCS eye subscore is 4. GCS verbal subscore is 5. GCS motor subscore is  6.  Skin: Skin is warm and dry.  Psychiatric: He has a normal mood and affect. His speech is normal and behavior is normal.  Nursing note and vitals reviewed.    ED Treatments / Results  Labs (all labs ordered are listed, but only abnormal results are displayed) Labs Reviewed  BASIC METABOLIC PANEL - Abnormal; Notable for the following:       Result Value   Glucose, Bld 121 (*)    Calcium 8.7 (*)    All other components within normal limits  CBC - Abnormal; Notable for the following:    WBC 12.1 (*)    All other components within normal limits  CBG MONITORING, ED - Abnormal; Notable for the following:    Glucose-Capillary 125 (*)    All other components within normal limits  MAGNESIUM  URINALYSIS, ROUTINE W REFLEX MICROSCOPIC  I-STAT TROPOININ, ED  I-STAT TROPOININ, ED    EKG  EKG Interpretation  Date/Time:  Tuesday September 16 2016 19:56:42 EDT Ventricular Rate:  51 PR Interval:    QRS Duration: 102 QT Interval:  490 QTC Calculation: 452 R Axis:   80 Text Interpretation:  Sinus bradycardia T wave abnormality Artifact Abnormal ekg Confirmed by Gerhard Munch  MD ([id number]) on 09/16/2016 8:01:32 PM       Radiology Dg Chest 2 View  Result Date: 09/16/2016 CLINICAL DATA:  Syncope and bradycardia. EXAM: CHEST  2 VIEW COMPARISON:  PA and lateral chest 11/18/2010. FINDINGS: The lungs are clear. Heart size is normal. No pneumothorax or pleural fluid. No bony abnormality. IMPRESSION: Negative chest. Electronically Signed   By: Drusilla Kanner M.D.   On: 09/16/2016 20:41   Ct Head Wo Contrast  Result Date: 09/16/2016 CLINICAL DATA:  Initial evaluation for acute syncope, bradycardia. EXAM: CT HEAD WITHOUT CONTRAST TECHNIQUE: Contiguous axial images were obtained from the base of the skull through the vertex without intravenous contrast. COMPARISON:  None. FINDINGS: Brain: Cerebral volume within normal limits for patient age. No evidence for acute intracranial hemorrhage. No  findings to suggest acute large vessel territory infarct. No mass lesion, midline shift, or mass effect. Ventricles are normal in size without evidence for hydrocephalus. No extra-axial fluid collection identified. Vascular: No hyperdense vessel identified.Scattered vascular calcifications noted within the carotid siphons. Scattered foci of gas within it cavernous sinus likely related IV access. Skull: Scalp soft tissues demonstrate no acute abnormality.Calvarium intact. Sinuses/Orbits: Globes and orbital soft tissues are within normal limits. Mild scattered mucosal thickening within the ethmoidal air cells. Paranasal sinuses are otherwise clear. No mastoid effusion. IMPRESSION: Negative head CT.  No acute intracranial process identified. Electronically Signed   By: Rise Mu M.D.   On: 09/16/2016 21:16    Procedures Procedures (including critical care time)  Medications Ordered in ED Medications  ondansetron [facility]) injection 4 mg (4 mg Intravenous Refused 09/16/16 2009)  sodium chloride 0.9 % bolus 1,000 mL (1,000 mLs Intravenous New Bag/Given 09/16/16 2008)     Initial Impression / Assessment and Plan / ED Course  I have reviewed the triage vital signs and the nursing notes.  Pertinent labs & imaging results that were available during my care of the patient were reviewed by me and  considered in my medical decision making (see chart for details).     65 year old male with previous history of bradycardia presents in the setting of syncopal event. Per wife and patient he was getting out of his car to walk into restaurant when he felt lightheaded and nauseated in the next thing the patient knew he awoke on the ground. Bystander reported to see patient at syncopal event falling striking back of head. Patient without injury to head and no bleeding on scene. Patient stable in route with EMS and glucose within normal limits.  On arrival patient was hemodynamically stable and afebrile.  Patient neurovascularly intact on examination. No tongue biting or urinary incontinence. No post ictal period noted. Patient without history of seizures. Patient complaining of mild nausea and EKG revealed sinus bradycardia with no signs of acute ischemia, ST segment elevation or depression. Initial troponin not elevated and no electrolyte abnormality is noted. No signs of anemia. Chest x-ray without significant abnormalities and CT head revealed no intracranial abnormality specifically no signs of hemorrhage. Patient had one further bout of emesis in the emergency department and otherwise without complaints. Patient continues to have bradycardia on monitor.  Have low suspicion for ACS, PE, ruptured AAA or other emergent condition at this time.  I had extensive informed discussion with patient and believe patient would be better served to be admitted to hospital for further cardiac monitoring and likely cardiology evaluation in the morning. Concerned that the syncope could be related to vasovagal syncopal event versus symptomatically bradycardia versus other arrhythmia. However patient reports he has pre-existing cardiologist as well as close primary care follow-up and he would like to be discharged home with outpatient management of this condition. After extensive discussion and in agreement that patient is capable of making his own medical decisions and is safe for this discharge plan at home. Patient and wife given strict return caution the patient reports he will call his primary care physician first thing in the morning. Patient was stable at time of discharge and in agreement with plan.  Final Clinical Impressions(s) / ED Diagnoses   Final diagnoses:  Syncope and collapse  Non-intractable vomiting with nausea, unspecified vomiting type  Bradycardia    New Prescriptions New Prescriptions   ONDANSETRON (ZOFRAN ODT) 4 MG DISINTEGRATING TABLET    Take 1 tablet (4 mg total) by mouth every 8 (eight)  hours as needed for nausea or vomiting.     Stacy Gardner, MD 09/16/16 2245    Gerhard Munch, MD 09/16/16 [phone]

## 2016-09-26 ENCOUNTER — Encounter: Payer: Self-pay | Admitting: Cardiovascular Disease

## 2016-09-26 ENCOUNTER — Ambulatory Visit (INDEPENDENT_AMBULATORY_CARE_PROVIDER_SITE_OTHER): Payer: Medicare Other | Admitting: Cardiovascular Disease

## 2016-09-26 VITALS — BP 148/86 | HR 53 | Ht 70.0 in | Wt 178.0 lb

## 2016-09-26 DIAGNOSIS — R55 Syncope and collapse: Secondary | ICD-10-CM | POA: Diagnosis not present

## 2016-09-26 NOTE — Assessment & Plan Note (Signed)
Warren Brown returns because a recent episode of syncope on 4/10. He had several episodes of loss of consciousness 2 years ago. He describes symptoms of nausea and vomiting and abdominal pain. I suspect his loss of consciousness was vagal. He went to the ER for evaluation. His EKG showed sinus bradycardia 51. I'm going to get a 2-D echo and a one-month event monitor and will see him back in 6 months unless there is anything that requires further evaluation. We did talk about not driving for 6 months.

## 2016-09-26 NOTE — Progress Notes (Signed)
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 He was referred by Dr. Foy Brown at [facility] for cardiovascular evaluation because of a recent episode of syncope. I last saw him in the office 01/23/15 He has no coronary vascular risk factors. He had his presyncopal episodes in quick succession in [place] on 01/15/15. This occurred after driving 12 hours from [place] to [place] without food or water. He did drink 4 beers that evening and had 3 sequential episodes of syncope. There is no loss of bowel or bladder control. When EMS arrived his blood pressure was noted to be 90/50. Recent blood work performed 2 days afterwards revealed a serum creatinine 1.75 which was significantly higher than his baseline of 1. He denies chest pain or shortness of breath. He recently had a syncopal episode on 09/16/16 which was probably vagal. It was preceded by nausea vomiting and abdominal pain. His EKG in the ER showed sinus bradycardia. He did fall and hit his head and was worked up for this. He is recovering from a concussion.   No current outpatient prescriptions on file.   No current facility-administered medications for this visit.     Allergies  Allergen Reactions  . Neosporin [Neomycin-Polymyxin-Gramicidin] Rash    Social History   Social History  . Marital status: Married    Spouse name: N/A  . Number of children: 2  . Years of education: N/A   Occupational History  . Not on file.   Social History Main Topics  . Smoking status: Never Smoker  . Smokeless tobacco: Never Used  . Alcohol use 0.5 oz/week    1 Standard drinks or equivalent per week  . Drug use: No  . Sexual activity:  Not on file   Other Topics Concern  . Not on file   Social History Narrative  . No narrative on file     Review of Systems: General: negative for chills, fever, night sweats or weight changes.  Cardiovascular: negative for chest pain, dyspnea on exertion, edema, orthopnea, palpitations, paroxysmal nocturnal dyspnea or shortness of breath Dermatological: negative for rash Respiratory: negative for cough or wheezing Urologic: negative for hematuria Abdominal: negative for nausea, vomiting, diarrhea, bright red blood per rectum, melena, or hematemesis Neurologic: negative for visual changes, syncope, or dizziness All other systems reviewed and are otherwise negative except as noted above.    Blood pressure (!) 148/86, pulse (!) 53, height  (1.778 m), weight 178 lb (80.7 kg).  General appearance: alert and no distress Neck: no adenopathy, no carotid bruit, no JVD, supple, symmetrical, trachea midline and thyroid not enlarged, symmetric, no tenderness/mass/nodules Lungs: clear to auscultation bilaterally Heart: regular rate and rhythm, S1, S2 normal, no murmur, click, rub or gallop Extremities: extremities normal, atraumatic, no cyanosis or edema  EKG not performed today  ASSESSMENT AND PLAN:   Syncope Warren Brown returns because a recent episode of syncope on 4/10. He had several episodes of loss of consciousness 2 years ago. He describes symptoms of nausea and vomiting and abdominal pain. I suspect his loss of consciousness was vagal. He went to the ER for evaluation. His EKG showed sinus bradycardia 51. I'm going to get a 2-D echo and  a one-month event monitor and will see him back in 6 months unless there is anything that requires further evaluation. We did talk about not driving for 6 months.      Warren Gess MD FACP,FACC,FAHA, [facility] 09/26/2016 11:53 AM

## 2016-09-26 NOTE — Patient Instructions (Addendum)
Medication Instructions: Your physician recommends that you continue on your current medications as directed. Please refer to the Current Medication list given to you today.   Testing/Procedures: Your physician has requested that you have an echocardiogram. Echocardiography is a painless test that uses sound waves to create images of your heart. It provides your doctor with information about the size and shape of your heart and how well your heart's chambers and valves are working. This procedure takes approximately one hour. There are no restrictions for this procedure.  Your physician has recommended that you wear a 30 day event monitor. Event monitors are medical devices that record the heart's electrical activity. Doctors most often [place] these monitors to diagnose arrhythmias. Arrhythmias are problems with the speed or rhythm of the heartbeat. The monitor is a small, portable device. You can wear one while you do your normal daily activities. This is usually used to diagnose what is causing palpitations/syncope (passing out).  Follow-Up: Your physician recommends that you schedule a follow-up appointment with Dr. Allyson Sabal in 6 months.  If you need a refill on your cardiac medications before your next appointment, please call your pharmacy.

## 2016-10-06 ENCOUNTER — Ambulatory Visit (INDEPENDENT_AMBULATORY_CARE_PROVIDER_SITE_OTHER): Payer: Medicare Other

## 2016-10-06 DIAGNOSIS — R55 Syncope and collapse: Secondary | ICD-10-CM | POA: Diagnosis not present

## 2016-10-09 ENCOUNTER — Encounter: Payer: Self-pay | Admitting: Cardiovascular Disease

## 2016-10-14 ENCOUNTER — Other Ambulatory Visit: Payer: Self-pay

## 2016-10-14 ENCOUNTER — Ambulatory Visit (HOSPITAL_COMMUNITY): Payer: Medicare Other | Attending: Cardiovascular Disease

## 2016-10-14 DIAGNOSIS — R55 Syncope and collapse: Secondary | ICD-10-CM | POA: Diagnosis present

## 2016-10-14 DIAGNOSIS — I517 Cardiomegaly: Secondary | ICD-10-CM | POA: Insufficient documentation

## 2016-10-24 ENCOUNTER — Encounter: Payer: Self-pay | Admitting: Cardiovascular Disease

## 2016-11-11 ENCOUNTER — Ambulatory Visit (INDEPENDENT_AMBULATORY_CARE_PROVIDER_SITE_OTHER): Payer: Medicare Other | Admitting: Cardiovascular Disease

## 2016-11-11 ENCOUNTER — Encounter: Payer: Self-pay | Admitting: Cardiovascular Disease

## 2016-11-11 DIAGNOSIS — R55 Syncope and collapse: Secondary | ICD-10-CM | POA: Diagnosis not present

## 2016-11-11 NOTE — Progress Notes (Signed)
Warren Brown returns today for follow-up of his event monitor for syncope. He does feel a syncopal episode that occurred recently was vagally mediated. Event monitor showed sinus rhythm, sinus bradycardia with 1 episode of AV dissociation. He said he is aware of the sugars for his vagal episode. His aware of not driving for 6 months from his syncopal episode. He is on no rate lowering drugs. 2-D echo was normal except for mildly dilated ascending aortic root and moderate left atrial dilatation. I will see him back in 6 months. 

## 2016-11-11 NOTE — Assessment & Plan Note (Signed)
Mr. Warren Brown returns today for follow-up of his event monitor for syncope. He does feel a syncopal episode that occurred recently was vagally mediated. Event monitor showed sinus rhythm, sinus bradycardia with 1 episode of AV dissociation. He said he is aware of the sugars for his vagal episode. His aware of not driving for 6 months from his syncopal episode. He is on no rate lowering drugs. 2-D echo was normal except for mildly dilated ascending aortic root and moderate left atrial dilatation. I will see him back in 6 months.

## 2016-11-11 NOTE — Patient Instructions (Signed)

## 2017-02-17 ENCOUNTER — Encounter: Payer: Self-pay | Admitting: Cardiovascular Disease

## 2017-02-17 ENCOUNTER — Ambulatory Visit (INDEPENDENT_AMBULATORY_CARE_PROVIDER_SITE_OTHER): Payer: Medicare Other | Admitting: Cardiovascular Disease

## 2017-02-17 DIAGNOSIS — R55 Syncope and collapse: Secondary | ICD-10-CM

## 2017-02-17 NOTE — Assessment & Plan Note (Signed)
History of syncope which occurred back in April probably vagally mediated. This was accompanied by nausea, vomiting or abdominal pain. In the ER he was bradycardic and hypotensive. Subsequent workup including 2-D echo was normal and event monitor showed sinus rhythm/sinus bradycardia. He's had no recurrent episodes.

## 2017-02-17 NOTE — Patient Instructions (Signed)
Medication Instructions: Your physician recommends that you continue on your current medications as directed. Please refer to the Current Medication list given to you today.   Follow-Up: Your physician recommends that you schedule a follow-up appointment as needed with Dr. Berry.    

## 2017-02-17 NOTE — Progress Notes (Signed)
02/17/2017 Warren PinaJoseph Stellmach   Feb 18, 1951  [id number]  Primary Physician Joycelyn RuaMeyers, Stephen, MD Primary Cardiologist: Runell GessJonathan J Berry MD Nicholes CalamityFACP, FACC, FAHA, [place]  HPI:  Warren Brown is a 66 y.o. 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 He was referred by Dr. Foy GuadalajaraFried at [facility] for cardiovascular evaluation because of a recent episode of syncope. I last saw him in the office 11/11/16 He has no coronary vascular risk factors. He had his presyncopal episodes in quick succession in [place] on 01/15/15. This occurred [place] without food or water. He did drink 4 beers that evening and had 3 sequential episodes of syncope. There is no loss of bowel or bladder control. When EMS arrived his blood pressure was noted to be 90/50. Recent blood work performed 2 days afterwards revealed a serum creatinine 1.75 which was significantly higher than his baseline of 1. He denies chest pain or shortness of breath. He recently had a syncopal episode on 09/16/16 which was probably vagal. It was preceded by nausea vomiting and abdominal pain. His EKG in the ER showed sinus bradycardia. He did fall and hit his head and was worked up for this. He has recovering from a concussion. Since I saw him in the office 3 months ago he's remained stable. He's had no further episodes of syncope which again sounded vagal.   No outpatient prescriptions have been marked as taking for the 02/17/17 encounter (Office Visit) with Runell GessBerry, Jonathan J, MD.     Allergies  Allergen Reactions  . Neosporin [Neomycin-Polymyxin-Gramicidin] Rash    Social History   Social History  . Marital status: Married    Spouse name: N/A  . Number of children: 2  . Years of education: N/A   Occupational History  . Not on file.   Social History Main Topics  . Smoking status: Never Smoker  .  Smokeless tobacco: Never Used  . Alcohol use 0.5 oz/week    1 Standard drinks or equivalent per week  . Drug use: No  . Sexual activity: Not on file   Other Topics Concern  . Not on file   Social History Narrative  . No narrative on file     Review of Systems: General: negative for chills, fever, night sweats or weight changes.  Cardiovascular: negative for chest pain, dyspnea on exertion, edema, orthopnea, palpitations, paroxysmal nocturnal dyspnea or shortness of breath Dermatological: negative for rash Respiratory: negative for cough or wheezing Urologic: negative for hematuria Abdominal: negative for nausea, vomiting, diarrhea, bright red blood per rectum, melena, or hematemesis Neurologic: negative for visual changes, syncope, or dizziness All other systems reviewed and are otherwise negative except as noted above.    Blood pressure (!) 146/78, pulse 64, height 5\' 10"  (1.778 m), weight 181 lb (82.1 kg).  General appearance: alert and no distress Neck: no adenopathy, no carotid bruit, no JVD, supple, symmetrical, trachea midline and thyroid not enlarged, symmetric, no tenderness/mass/nodules Lungs: clear to auscultation bilaterally Heart: regular rate and rhythm, S1, S2 normal, no murmur, click, rub or gallop Extremities: extremities normal, atraumatic, no cyanosis or edema  EKG performed today  ASSESSMENT AND PLAN:   Syncope History of syncope which occurred back in April probably vagally mediated. This was accompanied by nausea, vomiting or abdominal pain. In the ER he was bradycardic and hypotensive. Subsequent workup including 2-D echo was normal  and event monitor showed sinus rhythm/sinus bradycardia. He's had no recurrent episodes.      Runell Gess MD FACP,FACC,FAHA, [facility] 02/17/2017 8:32 AM

## 2018-03-30 ENCOUNTER — Encounter: Payer: Self-pay | Admitting: Cardiovascular Disease

## 2018-03-30 ENCOUNTER — Ambulatory Visit: Payer: Medicare Other | Admitting: Cardiovascular Disease

## 2018-03-30 VITALS — BP 134/86 | HR 48 | Ht 70.0 in | Wt 182.0 lb

## 2018-03-30 DIAGNOSIS — I517 Cardiomegaly: Secondary | ICD-10-CM | POA: Diagnosis not present

## 2018-03-30 DIAGNOSIS — I712 Thoracic aortic aneurysm, without rupture, unspecified: Secondary | ICD-10-CM

## 2018-03-30 NOTE — Patient Instructions (Signed)
Medication Instructions:  Your physician recommends that you continue on your current medications as directed. Please refer to the Current Medication list given to you today.  If you need a refill on your cardiac medications before your next appointment, please call your pharmacy.   Lab work: none If you have labs (blood work) drawn today and your tests are completely normal, you will receive your results only by: Marland Kitchen MyChart Message (if you have MyChart) OR . A paper copy in the mail If you have any lab test that is abnormal or we need to change your treatment, we will call you to review the results.  Testing/Procedures: Your physician has requested that you have an echocardiogram. Echocardiography is a painless test that uses sound waves to create images of your heart. It provides your doctor with information about the size and shape of your heart and how well your heart's chambers and valves are working. This procedure takes approximately one hour. There are no restrictions for this procedure.  Scheduled Oct. 2020  Follow-Up: At [facility], you and your health needs are our priority.  As part of our continuing mission to provide you with exceptional heart care, we have created designated Provider Care Teams.  These Care Teams include your primary Cardiologist (physician) and Advanced Practice Providers (APPs -  Physician Assistants and Nurse Practitioners) who all work together to provide you with the care you need, when you need it. You will need a follow up appointment in 2 years.  Please call our office 2 months in advance to schedule this appointment.  You may see Dr. Allyson Sabal or one of the following Advanced Practice Providers on your designated Care Team:   Corine Shelter, PA-C Judy Pimple, [place] . Marjie Skiff, PA-C  Any Other Special Instructions Will Be Listed Below (If Applicable).

## 2018-03-30 NOTE — Assessment & Plan Note (Signed)
His ascending thoracic aorta on 2D echo measured 40 mm.  We will follow this in 2 years by 2D echocardiography.

## 2018-03-30 NOTE — Progress Notes (Signed)
03/30/2018 Warren Brown   November 28, 1950  [id number]  Primary Physician Joycelyn Rua, MD Primary Cardiologist: Runell Gess MD Nicholes Calamity, [place]  HPI:  Warren Brown is a 67 y.o.  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 He was referred by Dr. Foy Guadalajara at [facility] for cardiovascular evaluation because of a recent episode of syncope. I last saw him in the office 02/17/2017.  He has no coronary vascular risk factors. He had his presyncopal episodes in quick succession in [place] on 01/15/15. This occurred after driving 12 hours from [place] to [place] without food or water. He did drink 4 beers that evening and had 3 sequential episodes of syncope. There is no loss of bowel or bladder control. When EMS arrived his blood pressure was noted to be 90/50. Recent blood work performed 2 days afterwards revealed a serum creatinine 1.75 which was significantly higher than his baseline of 1. He denies chest pain or shortness of breath. He recently had a syncopal episode on 09/16/16 which was probably vagal. It was preceded by nausea vomiting and abdominal pain. His EKG in the ER showed sinus bradycardia. He did fall and hit his head and was worked up for this. He is recovering from a concussion. Since I saw him a year ago he did obtain a Fitbit and follows his activity and heart rate closely.  He said no recurrent episodes.  He denies chest pain or shortness of breath.  Recent blood work performed by his PCP 10/07/2017 revealed total cholesterol 163, LDL of 80 and HDL of 71.   No outpatient medications have been marked as taking for the 03/30/18 encounter (Office Visit) with Runell Gess, MD.     Allergies  Allergen Reactions  . Neosporin [Neomycin-Polymyxin-Gramicidin] Rash    Social History   Socioeconomic History  . Marital status: Married    Spouse name: Not on file    . Number of children: 2  . Years of education: Not on file  . Highest education level: Not on file  Occupational History  . Not on file  Social Needs  . Financial resource strain: Not on file  . Food insecurity:    Worry: Not on file    Inability: Not on file  . Transportation needs:    Medical: Not on file    Non-medical: Not on file  Tobacco Use  . Smoking status: Never Smoker  . Smokeless tobacco: Never Used  Substance and Sexual Activity  . Alcohol use: Yes    Alcohol/week: 1.0 standard drinks    Types: 1 Standard drinks or equivalent per week  . Drug use: No  . Sexual activity: Not on file  Lifestyle  . Physical activity:    Days per week: Not on file    Minutes per session: Not on file  . Stress: Not on file  Relationships  . Social connections:    Talks on phone: Not on file    Gets together: Not on file    Attends religious service: Not on file    Active member of club or organization: Not on file    Attends meetings of clubs or organizations: Not on file    Relationship status: Not on file  . Intimate partner violence:    Fear of current or ex partner: Not on file    Emotionally abused: Not on file    Physically  abused: Not on file    Forced sexual activity: Not on file  Other Topics Concern  . Not on file  Social History Narrative  . Not on file     Review of Systems: General: negative for chills, fever, night sweats or weight changes.  Cardiovascular: negative for chest pain, dyspnea on exertion, edema, orthopnea, palpitations, paroxysmal nocturnal dyspnea or shortness of breath Dermatological: negative for rash Respiratory: negative for cough or wheezing Urologic: negative for hematuria Abdominal: negative for nausea, vomiting, diarrhea, bright red blood per rectum, melena, or hematemesis Neurologic: negative for visual changes, syncope, or dizziness All other systems reviewed and are otherwise negative except as noted above.    Blood pressure  134/86, pulse (!) 48, height 5\' 10"  (1.778 m), weight 182 lb (82.6 kg).  General appearance: alert and no distress Neck: no adenopathy, no carotid bruit, no JVD, supple, symmetrical, trachea midline and thyroid not enlarged, symmetric, no tenderness/mass/nodules Lungs: clear to auscultation bilaterally Heart: regular rate and rhythm, S1, S2 normal, no murmur, click, rub or gallop Extremities: extremities normal, atraumatic, no cyanosis or edema Pulses: 2+ and symmetric Skin: Skin color, texture, turgor normal. No rashes or lesions Neurologic: Alert and oriented X 3, normal strength and tone. Normal symmetric reflexes. Normal coordination and gait  EKG sinus bradycardia 48 without ST or T wave changes.  Personally reviewed this EKG.  ASSESSMENT AND PLAN:   Syncope History of syncope without episodes in the last 12 months.  He does wear a Fitbit and follows his activity and heart rate closely.  He did have a 2D echo that showed mild basal septal hypertrophy but otherwise was normal except for moderate left atrial dilatation and an event monitor that showed sinus bradycardia.  His average heart rate on his Fitbit is 49.  Thoracic aortic aneurysm (HCC) His ascending thoracic aorta on 2D echo measured 40 mm.  We will follow this in 2 years by 2D echocardiography.      Runell Gess MD FACP,FACC,FAHA, [facility] 03/30/2018 9:04 AM

## 2018-03-30 NOTE — Assessment & Plan Note (Signed)
History of syncope without episodes in the last 12 months.  He does wear a Fitbit and follows his activity and heart rate closely.  He did have a 2D echo that showed mild basal septal hypertrophy but otherwise was normal except for moderate left atrial dilatation and an event monitor that showed sinus bradycardia.  His average heart rate on his Fitbit is 49.

## 2019-07-24 ENCOUNTER — Ambulatory Visit: Payer: Medicare PPO | Attending: Internal Medicine

## 2019-07-24 DIAGNOSIS — Z23 Encounter for immunization: Secondary | ICD-10-CM | POA: Insufficient documentation

## 2019-07-24 NOTE — Progress Notes (Signed)
   Covid-19 Vaccination Clinic  Name:  Warren Brown    MRN: [id number] DOB: April 17, 1951  07/24/2019  Warren Brown was observed post Covid-19 immunization for 15 minutes without incidence. He was provided with Vaccine Information Sheet and instruction to access the V-Safe system.   Warren Brown was instructed to call 911 with any severe reactions post vaccine: Marland Kitchen Difficulty breathing  . Swelling of your face and throat  . A fast heartbeat  . A bad rash all over your body  . Dizziness and weakness    Immunizations Administered    Name Date Dose VIS Date Route   Pfizer COVID-19 Vaccine 07/24/2019  2:34 PM 0.3 mL 05/20/2019 Intramuscular   Manufacturer: [employer], [employer]   Lot: [id number]   NDC: [address]-1

## 2019-08-16 ENCOUNTER — Ambulatory Visit: Payer: Medicare PPO | Attending: Internal Medicine

## 2019-08-16 DIAGNOSIS — Z23 Encounter for immunization: Secondary | ICD-10-CM | POA: Insufficient documentation

## 2019-08-16 NOTE — Progress Notes (Signed)
   Covid-19 Vaccination Clinic  Name:  Belen Zwahlen    MRN: [id number] DOB: 1950-11-04  08/16/2019  Mr. Graumann was observed post Covid-19 immunization for 15 minutes without incident. He was provided with Vaccine Information Sheet and instruction to access the V-Safe system.   Mr. Glassco was instructed to call 911 with any severe reactions post vaccine: Marland Kitchen Difficulty breathing  . Swelling of face and throat  . A fast heartbeat  . A bad rash all over body  . Dizziness and weakness   Immunizations Administered    Name Date Dose VIS Date Route   Pfizer COVID-19 Vaccine 08/16/2019  2:20 PM 0.3 mL 05/20/2019 Intramuscular   Manufacturer: [employer], [employer]   Lot: [id number]   NDC: [address]-9

## 2019-08-17 ENCOUNTER — Ambulatory Visit: Payer: Medicare PPO

## 2019-10-25 ENCOUNTER — Other Ambulatory Visit: Payer: Self-pay

## 2019-10-25 ENCOUNTER — Encounter: Payer: Self-pay | Admitting: Cardiovascular Disease

## 2019-10-25 ENCOUNTER — Ambulatory Visit: Payer: Medicare PPO | Admitting: Cardiovascular Disease

## 2019-10-25 DIAGNOSIS — I712 Thoracic aortic aneurysm, without rupture, unspecified: Secondary | ICD-10-CM

## 2019-10-25 DIAGNOSIS — R001 Bradycardia, unspecified: Secondary | ICD-10-CM

## 2019-10-25 LAB — BASIC METABOLIC PANEL
BUN/Creatinine Ratio: 15 (ref 10–24)
BUN: 16 mg/dL (ref 8–27)
CO2: 26 mmol/L (ref 20–29)
Calcium: 9 mg/dL (ref 8.6–10.2)
Chloride: 104 mmol/L (ref 96–106)
Creatinine, Ser: 1.09 mg/dL (ref 0.76–1.27)
GFR calc Af Amer: 79 mL/min/{1.73_m2} (ref >59)
GFR calc non Af Amer: 68 mL/min/{1.73_m2} (ref >59)
Glucose: 82 mg/dL (ref 65–99)
Potassium: 4.8 mmol/L (ref 3.5–5.2)
Sodium: 141 mmol/L (ref 134–144)

## 2019-10-25 NOTE — Assessment & Plan Note (Signed)
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  He had syncope in the past but none since I saw him several years ago.  Exercises on a daily basis and walks or runs 50 miles a week.  His resting heart rate is in the low 50s.

## 2019-10-25 NOTE — Assessment & Plan Note (Signed)
History of thoracic aortic aneurysm by 2D echo several years ago measuring 40 mm.  We will check a chest CTA to further evaluate

## 2019-10-25 NOTE — Progress Notes (Signed)
10/25/2019 Nickalaus Crooke   02-24-1951  [id number]  Primary Physician Orpah Melter, MD Primary Cardiologist: Lorretta Harp MD Lupe Carney, [place]  HPI:  Warren Brown is a 69 y.o.  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 He was referred by Dr. Maceo Pro at [facility] for cardiovascular evaluation because of a recent episode of syncope.I last saw him in the office  03/30/2018. He has no coronary vascular risk factors. He had his presyncopal episodes in quick succession in [place] on 01/15/15. This occurred after driving 12 hours from [place] to [place] without food or water. He did drink 4 beers that evening and had 3 sequential episodes of syncope. There is no loss of bowel or bladder control. When EMS arrived his blood pressure was noted to be 90/50. Recent blood work performed 2 days afterwards revealed a serum creatinine 1.75 which was significantly higher than his baseline of 1. He denies chest pain or shortness of breath.He recently had a syncopal episode on 09/16/16 which was probably vagal. It was preceded by nausea vomiting and abdominal pain. His EKG in the ER showed sinus bradycardia. He did fall and hit his head and was worked up for this. He is recovering from a concussion.  Since I saw him 2 years ago he continues to do well.  He did buy a fit bit to monitor his activity and walks approximate 50 miles a week.  He is asymptomatic.  Said no recurrent syncope.  Resting heart rate is in the low 50s.  He denies chest pain or shortness of breath.  He does have a small ascending thoracic aortic aneurysm measuring 40 mm by 2D echocardiogram 10/14/2016.  His most recent lipid profile performed 03/24/2019 revealed total cholesterol 159, LDL 74 and HDL 68.   Current Meds  Medication Sig  . ciclopirox (PENLAC) 8 % solution      Allergies  Allergen Reactions  . Neosporin  [Neomycin-Polymyxin-Gramicidin] Rash    Social History   Socioeconomic History  . Marital status: Married    Spouse name: Not on file  . Number of children: 2  . Years of education: Not on file  . Highest education level: Not on file  Occupational History  . Not on file  Tobacco Use  . Smoking status: Never Smoker  . Smokeless tobacco: Never Used  Substance and Sexual Activity  . Alcohol use: Yes    Alcohol/week: 1.0 standard drinks    Types: 1 Standard drinks or equivalent per week  . Drug use: No  . Sexual activity: Not on file  Other Topics Concern  . Not on file  Social History Narrative  . Not on file   Social Determinants of Health   Financial Resource Strain:   . Difficulty of Paying Living Expenses:   Food Insecurity:   . Worried About Charity fundraiser in the Last Year:   . Arboriculturist in the Last Year:   Transportation Needs:   . Film/video editor (Medical):   Marland Kitchen Lack of Transportation (Non-Medical):   Physical Activity:   . Days of Exercise per Week:   . Minutes of Exercise per Session:   Stress:   . Feeling of Stress :   Social Connections:   . Frequency of Communication with Friends and Family:   . Frequency of Social Gatherings with Friends and Family:   . Attends  Religious Services:   . Active Member of Clubs or Organizations:   . Attends Banker Meetings:   Marland Kitchen Marital Status:   Intimate Partner Violence:   . Fear of Current or Ex-Partner:   . Emotionally Abused:   Marland Kitchen Physically Abused:   . Sexually Abused:      Review of Systems: General: negative for chills, fever, night sweats or weight changes.  Cardiovascular: negative for chest pain, dyspnea on exertion, edema, orthopnea, palpitations, paroxysmal nocturnal dyspnea or shortness of breath Dermatological: negative for rash Respiratory: negative for cough or wheezing Urologic: negative for hematuria Abdominal: negative for nausea, vomiting, diarrhea, bright red blood  per rectum, melena, or hematemesis Neurologic: negative for visual changes, syncope, or dizziness All other systems reviewed and are otherwise negative except as noted above.    Blood pressure 134/88, pulse (!) 57, temperature (!) 97.2 F (36.2 C), height 5\' 10"  (1.778 m), weight 184 lb 12.8 oz (83.8 kg).  General appearance: alert and no distress Neck: no adenopathy, no carotid bruit, no JVD, supple, symmetrical, trachea midline and thyroid not enlarged, symmetric, no tenderness/mass/nodules Lungs: clear to auscultation bilaterally Heart: regular rate and rhythm, S1, S2 normal, no murmur, click, rub or gallop Extremities: extremities normal, atraumatic, no cyanosis or edema Pulses: 2+ and symmetric Skin: Skin color, texture, turgor normal. No rashes or lesions Neurologic: Alert and oriented X 3, normal strength and tone. Normal symmetric reflexes. Normal coordination and gait  EKG sinus bradycardia 57 without ST or T wave changes. I Personally reviewed this EKG.  ASSESSMENT AND PLAN:   Thoracic aortic aneurysm (HCC) History of thoracic aortic aneurysm by 2D echo several years ago measuring 40 mm.  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  He had syncope in the past but none since I saw him several years ago.  Exercises on a daily basis and walks or runs 50 miles a week.  His resting heart rate is in the low 50s.      MD FACP,FACC,FAHA, [facility] 10/25/2019 10:42 AM

## 2019-10-25 NOTE — Patient Instructions (Signed)
Medication Instructions:  The current medical regimen is effective;  continue present plan and medications.  *If you need a refill on your cardiac medications before your next appointment, please call your pharmacy*   Lab Work: BMET today  If you have labs (blood work) drawn today and your tests are completely normal, you will receive your results only by: Marland Kitchen MyChart Message (if you have MyChart) OR . A paper copy in the mail If you have any lab test that is abnormal or we need to change your treatment, we will call you to review the results.   Testing/Procedures: CHEST CT ANGIO    Follow-Up: At [facility], you and your health needs are our priority.  As part of our continuing mission to provide you with exceptional heart care, we have created designated Provider Care Teams.  These Care Teams include your primary Cardiologist (physician) and Advanced Practice Providers (APPs -  Physician Assistants and Nurse Practitioners) who all work together to provide you with the care you need, when you need it.  We recommend signing up for the patient portal called "MyChart".  Sign up information is provided on this After Visit Summary.  MyChart is used to connect with patients for Virtual Visits (Telemedicine).  Patients are able to view lab/test results, encounter notes, upcoming appointments, etc.  Non-urgent messages can be sent to your provider as well.   To learn more about what you can do with MyChart, go to [email or web address].    Your next appointment:   12 month(s)  The format for your next appointment:   In Person  Provider:   Nanetta Batty, MD

## 2019-11-04 ENCOUNTER — Ambulatory Visit (INDEPENDENT_AMBULATORY_CARE_PROVIDER_SITE_OTHER)
Admission: RE | Admit: 2019-11-04 | Discharge: 2019-11-04 | Disposition: A | Discharge status: Home / Self Care | Payer: Medicare PPO | Source: Ambulatory Visit | Attending: Cardiovascular Disease | Admitting: Cardiovascular Disease

## 2019-11-04 ENCOUNTER — Other Ambulatory Visit: Payer: Self-pay

## 2019-11-04 DIAGNOSIS — I712 Thoracic aortic aneurysm, without rupture, unspecified: Secondary | ICD-10-CM

## 2019-11-04 MED ORDER — IOHEXOL 350 MG/ML SOLN
100.0000 mL | Freq: Once | INTRAVENOUS | Status: AC | PRN
  Administered 2019-11-04: 100 mL via INTRAVENOUS

## 2019-11-10 ENCOUNTER — Telehealth: Payer: Self-pay

## 2019-11-10 DIAGNOSIS — I712 Thoracic aortic aneurysm, without rupture, unspecified: Secondary | ICD-10-CM

## 2019-11-10 NOTE — Telephone Encounter (Signed)
Spoke to patient chest ct results given.Advised to repeat in 1 year.Advised needs lipid panel.Orders placed.

## 2019-11-11 ENCOUNTER — Other Ambulatory Visit: Payer: Self-pay

## 2019-11-11 DIAGNOSIS — I517 Cardiomegaly: Secondary | ICD-10-CM

## 2019-11-11 DIAGNOSIS — I712 Thoracic aortic aneurysm, without rupture, unspecified: Secondary | ICD-10-CM

## 2019-11-11 NOTE — Progress Notes (Signed)
lipid

## 2019-11-18 LAB — LIPID PANEL
Chol/HDL Ratio: 2.1 ratio (ref 0.0–5.0)
Cholesterol, Total: 141 mg/dL (ref 100–199)
HDL: 66 mg/dL (ref >39)
LDL Chol Calc (NIH): 65 mg/dL (ref 0–99)
Triglycerides: 45 mg/dL (ref 0–149)
VLDL Cholesterol Cal: 10 mg/dL (ref 5–40)

## 2020-02-22 DIAGNOSIS — R972 Elevated prostate specific antigen [PSA]: Secondary | ICD-10-CM | POA: Diagnosis not present

## 2020-02-22 DIAGNOSIS — N5201 Erectile dysfunction due to arterial insufficiency: Secondary | ICD-10-CM | POA: Diagnosis not present

## 2020-02-22 DIAGNOSIS — N401 Enlarged prostate with lower urinary tract symptoms: Secondary | ICD-10-CM | POA: Diagnosis not present

## 2020-02-22 DIAGNOSIS — R3915 Urgency of urination: Secondary | ICD-10-CM | POA: Diagnosis not present

## 2020-03-12 ENCOUNTER — Other Ambulatory Visit (HOSPITAL_BASED_OUTPATIENT_CLINIC_OR_DEPARTMENT_OTHER): Payer: Self-pay | Admitting: Internal Medicine

## 2020-03-12 ENCOUNTER — Other Ambulatory Visit: Payer: Self-pay

## 2020-03-12 ENCOUNTER — Ambulatory Visit: Payer: Medicare PPO | Attending: Internal Medicine

## 2020-03-12 DIAGNOSIS — Z23 Encounter for immunization: Secondary | ICD-10-CM

## 2020-03-12 NOTE — Progress Notes (Signed)
   Covid-19 Vaccination Clinic  Name:  Maikel Neisler    MRN: [id number] DOB: [birth year]  03/12/2020  Mr. Scherman was observed post Covid-19 immunization for 15 minutes without incident. He was provided with Vaccine Information Sheet and instruction to access the V-Safe system.  Vaccinated by Theodis Sato  Mr. Patriarca was instructed to call 911 with any severe reactions post vaccine: Marland Kitchen Difficulty breathing  . Swelling of face and throat  . A fast heartbeat  . A bad rash all over body  . Dizziness and weakness

## 2020-03-16 MED FILL — PFIZER-BIONTECH COVID-19 VA: 30 | 1 days supply | Qty: 0 | Fill #0

## 2020-04-13 DIAGNOSIS — Z136 Encounter for screening for cardiovascular disorders: Secondary | ICD-10-CM | POA: Diagnosis not present

## 2020-04-13 DIAGNOSIS — Z23 Encounter for immunization: Secondary | ICD-10-CM | POA: Diagnosis not present

## 2020-04-13 DIAGNOSIS — I712 Thoracic aortic aneurysm, without rupture: Secondary | ICD-10-CM | POA: Diagnosis not present

## 2020-04-13 DIAGNOSIS — Z0000 Encounter for general adult medical examination without abnormal findings: Secondary | ICD-10-CM | POA: Diagnosis not present

## 2020-10-19 ENCOUNTER — Other Ambulatory Visit: Payer: Self-pay

## 2020-10-19 ENCOUNTER — Ambulatory Visit: Payer: Medicare PPO | Admitting: Cardiovascular Disease

## 2020-10-19 ENCOUNTER — Ambulatory Visit (INDEPENDENT_AMBULATORY_CARE_PROVIDER_SITE_OTHER)
Admission: RE | Admit: 2020-10-19 | Discharge: 2020-10-19 | Disposition: A | Discharge status: Home / Self Care | Payer: Self-pay | Source: Ambulatory Visit | Attending: Cardiovascular Disease | Admitting: Cardiovascular Disease

## 2020-10-19 ENCOUNTER — Encounter: Payer: Self-pay | Admitting: Cardiovascular Disease

## 2020-10-19 VITALS — BP 140/81 | HR 57 | Ht 70.0 in | Wt 182.4 lb

## 2020-10-19 DIAGNOSIS — I712 Thoracic aortic aneurysm, without rupture, unspecified: Secondary | ICD-10-CM

## 2020-10-19 DIAGNOSIS — I2510 Atherosclerotic heart disease of native coronary artery without angina pectoris: Secondary | ICD-10-CM | POA: Diagnosis not present

## 2020-10-19 DIAGNOSIS — R55 Syncope and collapse: Secondary | ICD-10-CM

## 2020-10-19 DIAGNOSIS — R931 Abnormal findings on diagnostic imaging of heart and coronary circulation: Secondary | ICD-10-CM | POA: Insufficient documentation

## 2020-10-19 NOTE — Assessment & Plan Note (Signed)
Last CTA of his chest performed 10/27/2019 showed an ascending thoracic aorta measuring 38 mm in its largest dimension.  This does not need to be followed up anytime soon.

## 2020-10-19 NOTE — Assessment & Plan Note (Signed)
Has not had syncope in several years.

## 2020-10-19 NOTE — Assessment & Plan Note (Signed)
Chest CTA performed 10/27/2019 did show calcified plaque in the distribution of the distal left main, LAD and circumflex origin.  He is completely asymptomatic and exercises vigorously on a daily basis.  I am going to get a coronary calcium score to further evaluate.

## 2020-10-19 NOTE — Progress Notes (Signed)
10/19/2020 Warren Brown   March 12, 1951  [id number]  Primary Physician Warren Rua, MD Primary Cardiologist: Warren Gess MD Warren Brown, [place]  HPI:  Warren Brown is a 70 y.o.    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 He was referred by Dr. Foy Brown at [facility] for cardiovascular evaluation because of an episode of syncope.I last saw him in the office  10/25/2019.He has no coronary vascular risk factors. He had his presyncopal episodes in quick succession in [place] on 01/15/15. This occurred after driving 12 hours from [place] to [place] without food or water. He did drink 4 beers that evening and had 3 sequential episodes of syncope. There is no loss of bowel or bladder control. When EMS arrived his blood pressure was noted to be 90/50. Recent blood work performed 2 days afterwards revealed a serum creatinine 1.75 which was significantly higher than his baseline of 1. He denies chest pain or shortness of breath.He recently had a syncopal episode on 09/16/16 which was probably vagal. It was preceded by nausea vomiting and abdominal pain. His EKG in the ER showed sinus bradycardia. He did fall and hit his head and was worked up for this. He is recovering from a concussion.  Since I saw him  a year ago he continues to do well.  He did buy a fit bit to monitor his activity and walks approximate 40-50 miles a week.  He is asymptomatic.  He also exercises lifting weights 2-3 times a day.   He has had no recurrent syncope.  Resting heart rate is in the low 50s.  He denies chest pain or shortness of breath.    He did have some focal pain in his left breast which sounds like mastalgia and does not sound cardiovascular nature.  He does have a small ascending thoracic aortic aneurysm measuring 40 mm by 2D echocardiogram 10/14/2016.    A follow-up CTA of his chest performed  10/27/2019 revealed this to have measured 38 mm.  The CTA did however also mentioned calcification in the distal left main and ostia of the LAD and circumflex coronary arteries.   Current Meds  Medication Sig  . COVID-19 mRNA vaccine, Pfizer, 30 MCG/0.3ML injection INJECT AS DIRECTED     Allergies  Allergen Reactions  . Neosporin [Neomycin-Polymyxin-Gramicidin] Rash    Social History   Socioeconomic History  . Marital status: Married    Spouse name: Not on file  . Number of children: 2  . Years of education: Not on file  . Highest education level: Not on file  Occupational History  . Not on file  Tobacco Use  . Smoking status: Never Smoker  . Smokeless tobacco: Never Used  Substance and Sexual Activity  . Alcohol use: Yes    Alcohol/week: 1.0 standard drink    Types: 1 Standard drinks or equivalent per week  . Drug use: No  . Sexual activity: Not on file  Other Topics Concern  . Not on file  Social History Narrative  . Not on file   Social Determinants of Health   Financial Resource Strain: Not on file  Food Insecurity: Not on file  Transportation Needs: Not on file  Physical Activity: Not on file  Stress: Not on file  Social Connections: Not on file  Intimate Partner Violence: Not on file     Review of Systems: General: negative for  chills, fever, night sweats or weight changes.  Cardiovascular: negative for chest pain, dyspnea on exertion, edema, orthopnea, palpitations, paroxysmal nocturnal dyspnea or shortness of breath Dermatological: negative for rash Respiratory: negative for cough or wheezing Urologic: negative for hematuria Abdominal: negative for nausea, vomiting, diarrhea, bright red blood per rectum, melena, or hematemesis Neurologic: negative for visual changes, syncope, or dizziness All other systems reviewed and are otherwise negative except as noted above.    Blood pressure 140/81, pulse (!) 57, height 5\' 10"  (1.778 m), weight 182 lb 6.4 oz  (82.7 kg), SpO2 97 %.  General appearance: alert and no distress Neck: no adenopathy, no carotid bruit, no JVD, supple, symmetrical, trachea midline and thyroid not enlarged, symmetric, no tenderness/mass/nodules Lungs: clear to auscultation bilaterally Heart: regular rate and rhythm, S1, S2 normal, no murmur, click, rub or gallop Extremities: extremities normal, atraumatic, no cyanosis or edema Pulses: 2+ and symmetric Skin: Skin color, texture, turgor normal. No rashes or lesions Neurologic: Alert and oriented X 3, normal strength and tone. Normal symmetric reflexes. Normal coordination and gait  EKG sinus bradycardia 57 without ST or T wave changes.  I personally reviewed this EKG.  ASSESSMENT AND PLAN:   Syncope Has not had syncope in several years.  Thoracic aortic aneurysm [facility]) Last CTA of his chest performed 10/27/2019 showed an ascending thoracic aorta measuring 38 mm in its largest dimension.  This does not need to be followed up anytime soon.  Coronary artery calcification seen on CAT scan Chest CTA performed 10/27/2019 did show calcified plaque in the distribution of the distal left main, LAD and circumflex origin.  He is completely asymptomatic and exercises vigorously on a daily basis.  I am going to get a coronary calcium score to further evaluate.      10/29/2019 MD FACP,FACC,FAHA, [facility] 10/19/2020 8:21 AM

## 2020-10-19 NOTE — Patient Instructions (Signed)
Medication Instructions:  No Changes In Medications at this time.  *If you need a refill on your cardiac medications before your next appointment, please call your pharmacy*  Testing/Procedures: Dr. Berry  has ordered a CT coronary calcium score. This test is done at [address]. This is $99 out of pocket.   Coronary CalciumScan A coronary calcium scan is an imaging test used to look for deposits of calcium and other fatty materials (plaques) in the inner lining of the blood vessels of the heart (coronary arteries). These deposits of calcium and plaques can partly clog and narrow the coronary arteries without producing any symptoms or warning signs. This puts a person at risk for a heart attack. This test can detect these deposits before symptoms develop. Tell a health care provider about:  Any allergies you have.  All medicines you are taking, including vitamins, herbs, eye drops, creams, and over-the-counter medicines.  Any problems you or family members have had with anesthetic medicines.  Any blood disorders you have.  Any surgeries you have had.  Any medical conditions you have.  Whether you are pregnant or may be pregnant. What are the risks? Generally, this is a safe procedure. However, problems may occur, including:  Harm to a pregnant woman and her unborn baby. This test involves the use of radiation. Radiation exposure can be dangerous to a pregnant woman and her unborn baby. If you are pregnant, you generally should not have this procedure done.  Slight increase in the risk of cancer. This is because of the radiation involved in the test. What happens before the procedure? No preparation is needed for this procedure. What happens during the procedure?  You will undress and remove any jewelry around your neck or chest.  You will put on a hospital gown.  Sticky electrodes will be placed on your chest. The electrodes will be connected to an  electrocardiogram (ECG) machine to record a tracing of the electrical activity of your heart.  A CT scanner will take pictures of your heart. During this time, you will be asked to lie still and hold your breath for 2-3 seconds while a picture of your heart is being taken. The procedure may vary among health care providers and hospitals. What happens after the procedure?  You can get dressed.  You can return to your normal activities.  It is up to you to get the results of your test. Ask your health care provider, or the department that is doing the test, when your results will be ready. Summary  A coronary calcium scan is an imaging test used to look for deposits of calcium and other fatty materials (plaques) in the inner lining of the blood vessels of the heart (coronary arteries).  Generally, this is a safe procedure. Tell your health care provider if you are pregnant or may be pregnant.  No preparation is needed for this procedure.  A CT scanner will take pictures of your heart.  You can return to your normal activities after the scan is done. This information is not intended to replace advice given to you by your health care provider. Make sure you discuss any questions you have with your health care provider. Document Released: 11/22/2007 Document Revised: 04/14/2016 Document Reviewed: 04/14/2016 Elsevier Interactive Patient Education  2017 Elsevier Inc.  Follow-Up: At [facility], you and your health needs are our priority.  As part of our continuing mission to provide you with exceptional heart care, we have   created designated Provider Care Teams.  These Care Teams include your primary Cardiologist (physician) and Advanced Practice Providers (APPs -  Physician Assistants and Nurse Practitioners) who all work together to provide you with the care you need, when you need it.  Your next appointment:   1 year(s)  The format for your next appointment:   In Person  Provider:    Jonathan Berry, MD   

## 2020-11-09 ENCOUNTER — Other Ambulatory Visit: Payer: Self-pay | Admitting: *Deleted

## 2020-11-09 ENCOUNTER — Telehealth: Payer: Self-pay | Admitting: Cardiovascular Disease

## 2020-11-09 DIAGNOSIS — I712 Thoracic aortic aneurysm, without rupture, unspecified: Secondary | ICD-10-CM

## 2020-11-09 NOTE — Telephone Encounter (Signed)
Called patient, advised that the follow up scan for a repeat for one year to check status of TAA.  Left message to call back to get scheduled.

## 2020-11-09 NOTE — Progress Notes (Signed)
(+)   BM

## 2020-11-09 NOTE — Telephone Encounter (Signed)
Spoke with patient to discuss scheduling preferences for the repeat CTA chest/aorta ordered by Dr. Allyson Sabal.  Patient states he had a CT ( calcium scoring) done about 3 weeks ago and no one told him about other testing that might be needed.  Please advise

## 2020-11-09 NOTE — Telephone Encounter (Signed)
Order will be closed.  Thanks!

## 2020-11-09 NOTE — Telephone Encounter (Signed)
Pt does not need a CTA but given elevated Cor CA Score he does need a fasting lipid profile

## 2020-11-09 NOTE — Telephone Encounter (Signed)
Called patient, advised of message from MD.  Patient verbalized understanding and will have his blood work completed by his PCP and have them send it over.  Joyce Gross, Patient does not need CTA- will you remove him from being scheduled for this. Thank you!

## 2021-01-29 DIAGNOSIS — Z8249 Family history of ischemic heart disease and other diseases of the circulatory system: Secondary | ICD-10-CM | POA: Diagnosis not present

## 2021-01-29 DIAGNOSIS — Z809 Family history of malignant neoplasm, unspecified: Secondary | ICD-10-CM | POA: Diagnosis not present

## 2021-01-29 DIAGNOSIS — R030 Elevated blood-pressure reading, without diagnosis of hypertension: Secondary | ICD-10-CM | POA: Diagnosis not present

## 2021-01-29 DIAGNOSIS — N529 Male erectile dysfunction, unspecified: Secondary | ICD-10-CM | POA: Diagnosis not present

## 2021-02-21 DIAGNOSIS — R3915 Urgency of urination: Secondary | ICD-10-CM | POA: Diagnosis not present

## 2021-02-21 DIAGNOSIS — N5201 Erectile dysfunction due to arterial insufficiency: Secondary | ICD-10-CM | POA: Diagnosis not present

## 2021-02-21 DIAGNOSIS — N401 Enlarged prostate with lower urinary tract symptoms: Secondary | ICD-10-CM | POA: Diagnosis not present

## 2021-04-29 DIAGNOSIS — I471 Supraventricular tachycardia: Secondary | ICD-10-CM | POA: Diagnosis not present

## 2021-04-29 DIAGNOSIS — Z0000 Encounter for general adult medical examination without abnormal findings: Secondary | ICD-10-CM | POA: Diagnosis not present

## 2021-04-29 DIAGNOSIS — Z1211 Encounter for screening for malignant neoplasm of colon: Secondary | ICD-10-CM | POA: Diagnosis not present

## 2021-04-29 DIAGNOSIS — Z136 Encounter for screening for cardiovascular disorders: Secondary | ICD-10-CM | POA: Diagnosis not present

## 2021-04-29 DIAGNOSIS — Z23 Encounter for immunization: Secondary | ICD-10-CM | POA: Diagnosis not present

## 2021-04-29 DIAGNOSIS — I712 Thoracic aortic aneurysm, without rupture, unspecified: Secondary | ICD-10-CM | POA: Diagnosis not present

## 2021-09-10 IMAGING — CT CT ANGIO CHEST
3 of 8 series · 18 of 46 positions shown · IV contrast (OMNIPAQUE 350)
Comparison: None.

CLINICAL DATA: Mild dilatation of the ascending thoracic aorta by
prior echocardiography in 7014.

EXAM:
CT ANGIOGRAPHY CHEST WITH CONTRAST
TECHNIQUE: Multidetector CT imaging of the chest was performed using the
standard protocol during bolus administration of intravenous
contrast. Multiplanar CT image reconstructions and MIPs were
obtained to evaluate the vascular anatomy.
CONTRAST:  100mL OMNIPAQUE IOHEXOL 350 MG/ML SOLN

[Series 4: aorta 3.0 bf37 2 · axial · 0.77mm/px · z∈[-364,-60]mm · 13 of 119 slices shown]
[im 9/119  lung]
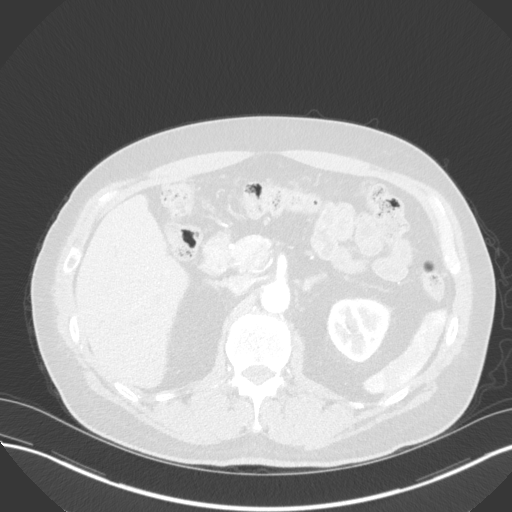
[im 17/119  soft-tissue]
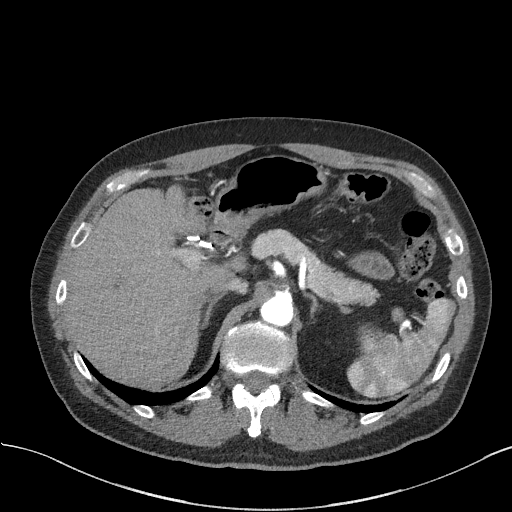
[im 26/119  lung]
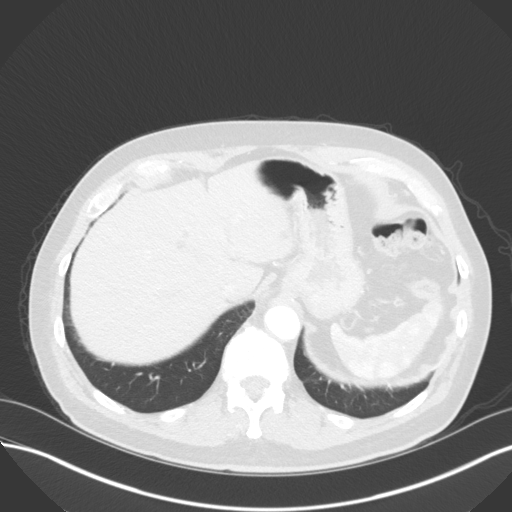
[im 34/119  soft-tissue]
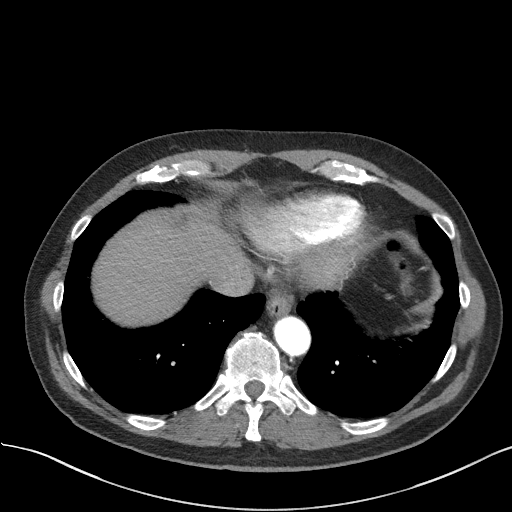
[im 43/119  lung]
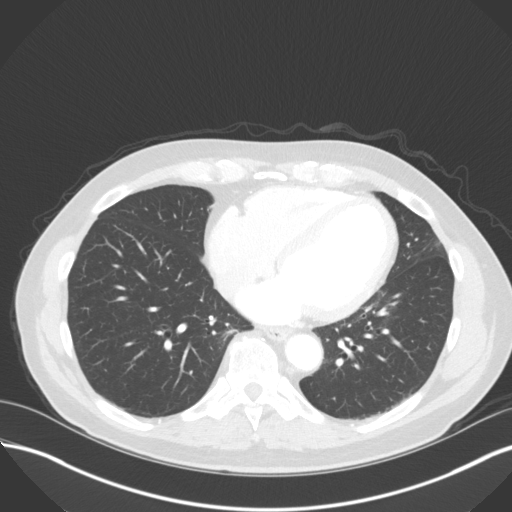
[im 51/119  soft-tissue]
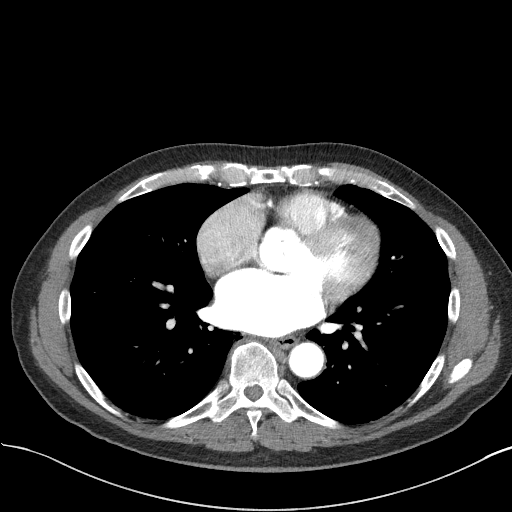
[im 60/119  lung]
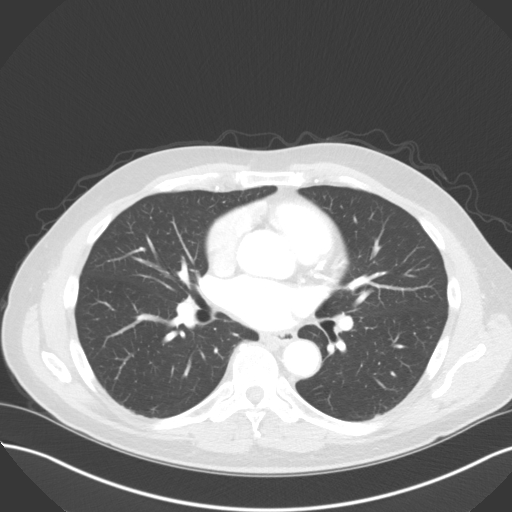
[im 68/119  soft-tissue]
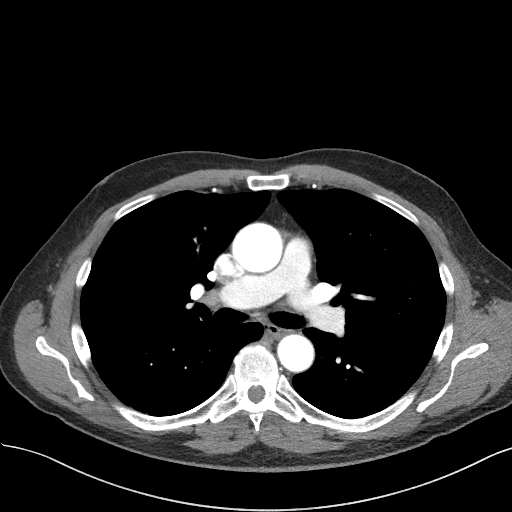
[im 76/119  lung]
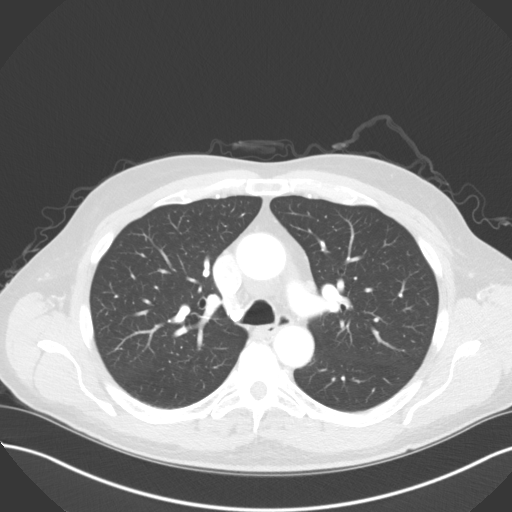
[im 85/119  soft-tissue]
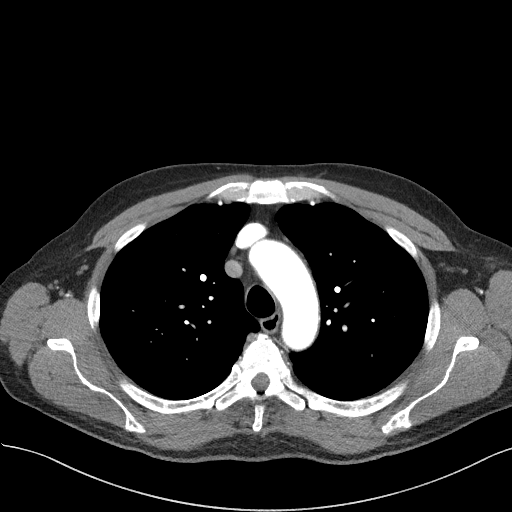
[im 93/119  lung]
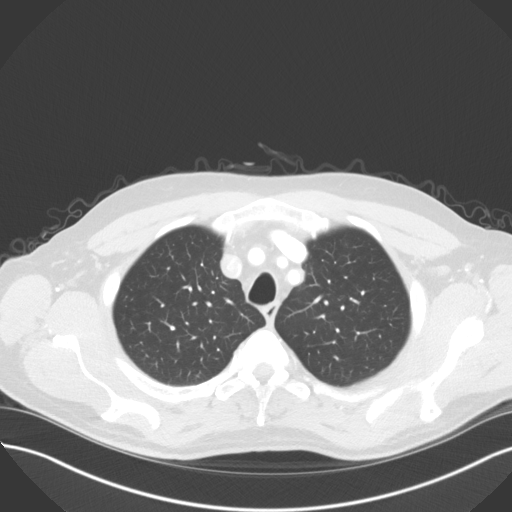
[im 102/119  soft-tissue]
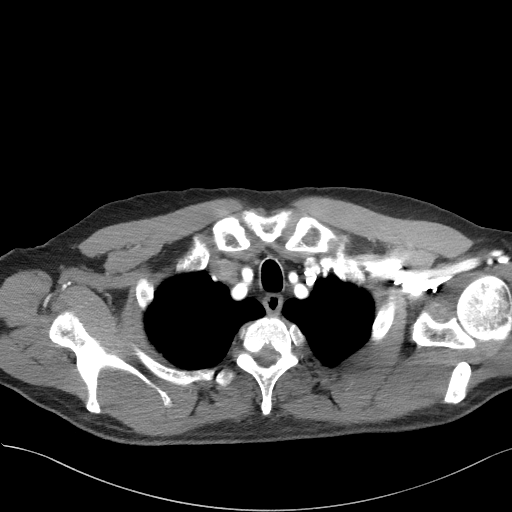
[im 110/119  lung]
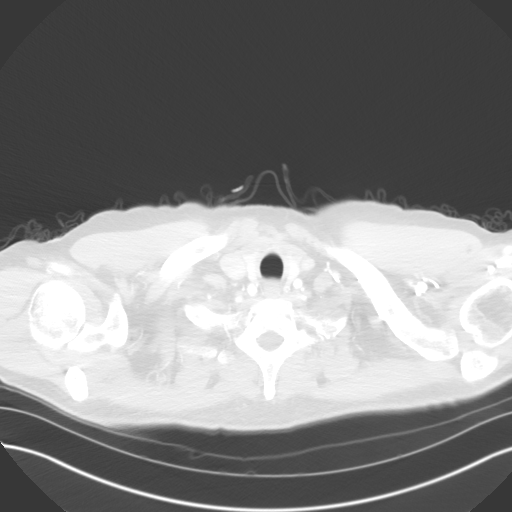

[Series 5: lung · axial · 0.77mm/px · z∈[-364,-312]mm · 2 of 119 slices shown]
[im 9/119  soft-tissue]
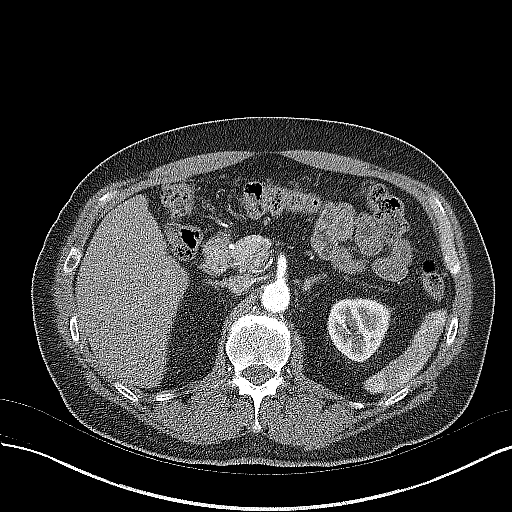
[im 26/119  soft-tissue]
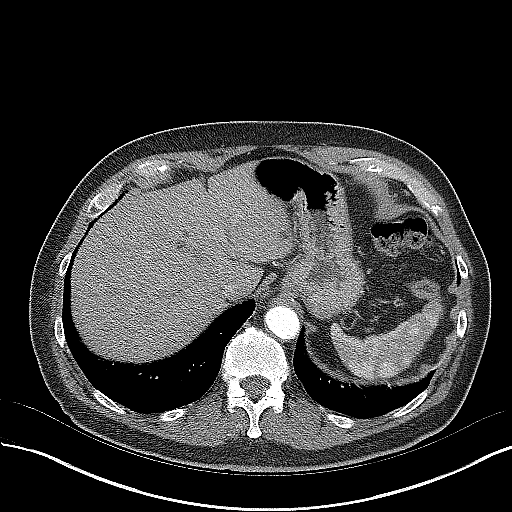

[Series 7: coronals · coronal · 0.72mm/px · 3 of 121 slices shown]
[im 31/121  soft-tissue]
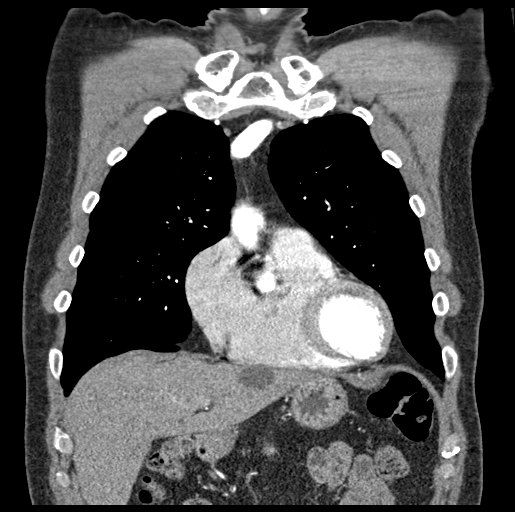
[im 61/121  soft-tissue]
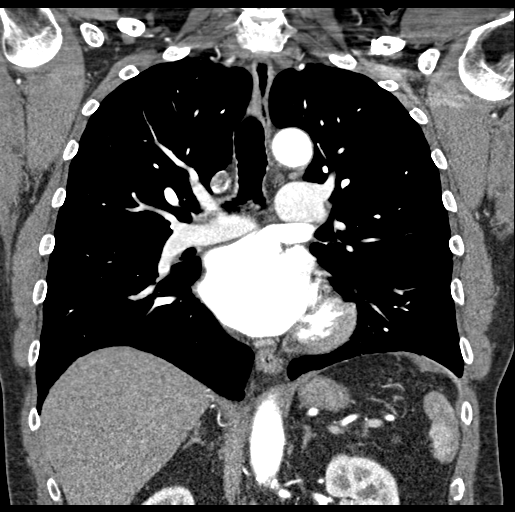
[im 91/121  soft-tissue]
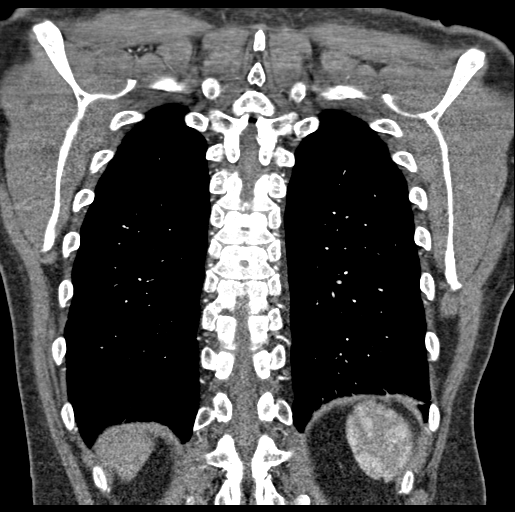

[18 of 46 positions shown; findings below may reference images not displayed]

FINDINGS: Cardiovascular: No significant aneurysmal disease of the thoracic
aorta is identified by CTA. The aortic root measures 3.5 cm at the
level of the sinuses of Valsalva. The ascending thoracic aorta
measures 3.8 cm in greatest diameter. The proximal arch measures
cm and the distal arch 2.8 cm. The descending thoracic aorta
measures 2.6 cm. No evidence of aortic dissection. Minimal calcified
plaque is present at the level of the aortic arch. Visualized
proximal great vessels demonstrate normal patency and normal
branching anatomy.

The heart size is normal. No pericardial fluid identified. There is
fairly extensive calcified plaque in the distribution of the distal
left main coronary artery, LAD and left circumflex origin. Central
pulmonary arteries are normal in caliber

Mediastinum/Nodes: No enlarged mediastinal, hilar, or axillary lymph
nodes. Thyroid gland, trachea, and esophagus demonstrate no
significant findings.

Lungs/Pleura: There is no evidence of pulmonary edema,
consolidation, pneumothorax, nodule or pleural fluid.

Upper Abdomen: No acute abnormality.

Musculoskeletal: No chest wall abnormality. No acute or significant
osseous findings.

Review of the MIP images confirms the above findings.
IMPRESSION: 1. No significant aneurysmal disease of the thoracic aorta by CTA.
The ascending thoracic aorta measures 3.8 cm in greatest diameter.
2. Coronary atherosclerosis with fairly extensive calcified plaque
in the distribution of the distal left main coronary artery, LAD and
left circumflex origin.

## 2021-11-05 DIAGNOSIS — Z1211 Encounter for screening for malignant neoplasm of colon: Secondary | ICD-10-CM | POA: Diagnosis not present

## 2021-11-05 DIAGNOSIS — K5730 Diverticulosis of large intestine without perforation or abscess without bleeding: Secondary | ICD-10-CM | POA: Diagnosis not present

## 2021-11-05 DIAGNOSIS — D123 Benign neoplasm of transverse colon: Secondary | ICD-10-CM | POA: Diagnosis not present

## 2021-11-05 DIAGNOSIS — K635 Polyp of colon: Secondary | ICD-10-CM | POA: Diagnosis not present

## 2021-11-05 DIAGNOSIS — K649 Unspecified hemorrhoids: Secondary | ICD-10-CM | POA: Diagnosis not present

## 2021-11-14 DIAGNOSIS — D123 Benign neoplasm of transverse colon: Secondary | ICD-10-CM | POA: Diagnosis not present

## 2021-11-19 ENCOUNTER — Ambulatory Visit (INDEPENDENT_AMBULATORY_CARE_PROVIDER_SITE_OTHER): Payer: Medicare PPO

## 2021-11-19 ENCOUNTER — Encounter: Payer: Self-pay | Admitting: Podiatry

## 2021-11-19 ENCOUNTER — Ambulatory Visit: Payer: Medicare PPO | Admitting: Podiatry

## 2021-11-19 DIAGNOSIS — M21622 Bunionette of left foot: Secondary | ICD-10-CM

## 2021-11-19 DIAGNOSIS — M21619 Bunion of unspecified foot: Secondary | ICD-10-CM

## 2021-11-19 DIAGNOSIS — M21612 Bunion of left foot: Secondary | ICD-10-CM

## 2021-11-22 ENCOUNTER — Encounter: Payer: Self-pay | Admitting: Podiatry

## 2021-11-22 NOTE — Progress Notes (Signed)
  Subjective:  Patient ID: Warren Brown, male    DOB: 1950-06-14,  MRN: [id number]  Chief Complaint  Patient presents with   Bunions    np left foot possible bunion or bursitis per pt    71 y.o. male presents with the above complaint. History confirmed with patient.  This area became inflamed 2 weeks ago but it is getting better now  Objective:  Physical Exam: warm, good capillary refill, no trophic changes or ulcerative lesions, normal DP and PT pulses, normal sensory exam, and talus bone deformity left foot, no areas of bursitis or fluctuance currently  Radiographs: Multiple views x-ray of the left foot: There is an tailor's bunion deformity with lateral bowing of the fifth metatarsal Assessment:   1. Tailor's bunion of left foot      Plan:  Patient was evaluated and treated and all questions answered.  Discussed the pathomechanics etiology and treatment options of tailor's bunion deformity clinic surgical and nonsurgical treatment.  Currently this is less inflamed now.  We discussed if it returns that corticosteroid injection could alleviate pain and pressure here.  Finally we discussed surgical correction of this if it continues to worsen.  He will return to see me as needed.  Return if symptoms worsen or fail to improve.

## 2021-12-06 ENCOUNTER — Encounter: Payer: Self-pay | Admitting: Cardiovascular Disease

## 2021-12-06 ENCOUNTER — Ambulatory Visit: Payer: Medicare PPO | Admitting: Cardiovascular Disease

## 2021-12-06 VITALS — BP 124/80 | HR 48 | Ht 70.0 in | Wt 174.0 lb

## 2021-12-06 DIAGNOSIS — I2510 Atherosclerotic heart disease of native coronary artery without angina pectoris: Secondary | ICD-10-CM | POA: Diagnosis not present

## 2021-12-06 DIAGNOSIS — R001 Bradycardia, unspecified: Secondary | ICD-10-CM

## 2021-12-06 DIAGNOSIS — I712 Thoracic aortic aneurysm, without rupture, unspecified: Secondary | ICD-10-CM | POA: Diagnosis not present

## 2021-12-06 DIAGNOSIS — R55 Syncope and collapse: Secondary | ICD-10-CM

## 2021-12-06 MED ORDER — ASPIRIN 81 MG PO TBEC: 81.0000 mg | DELAYED_RELEASE_TABLET | Freq: Every day | ORAL | 3 refills | Status: AC

## 2021-12-06 NOTE — Assessment & Plan Note (Signed)
Elevated coronary calcium score of 627 performed 10/19/2020.  All the calcium was in the LAD.  He is completely asymptomatic and exercises on a daily basis.  His most recent lipid profile performed 04/29/2021 revealed total cholesterol 54, LDL 70 and HDL of 72.

## 2021-12-06 NOTE — Progress Notes (Signed)
12/06/2021 Loyed Wilmes   1950/10/27  [id number]  Primary Physician Joycelyn Rua, MD Primary Cardiologist: Runell Gess MD Nicholes Calamity, [place]  HPI:  Warren Brown is a 71 y.o.    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 He was referred by Dr. Foy Gu[place]ara at [facility] for cardiovascular evaluation because of an episode of syncope. I last saw him in the office  10/19/2020.  He has no coronary vascular risk factors. He had his presyncopal episodes in quick succession in [place] on 01/15/15. This occurred after driving 12 hours from [place] to [place] without food or water. He did drink 4 beers that evening and had 3 sequential episodes of syncope. There is no loss of bowel or bladder control. When EMS arrived his blood pressure was noted to be 90/50. Recent blood work performed 2 days afterwards revealed a serum creatinine 1.75 which was significantly higher than his baseline of 1. He denies chest pain or shortness of breath. He recently had a syncopal episode on 09/16/16 which was probably vagal. It was preceded by nausea vomiting and abdominal pain. His EKG in the ER showed sinus bradycardia. He did fall and hit his head and was worked up for this. He is recovering from a concussion.   Since I saw him  a year ago he continues to do well.  He remains very active walking/jogging 50 miles a week.  He is completely asymptomatic.  He said no episodes of mastalgia.  He does have resting bradycardia because of being in such good shape.  He has mild dilatation of his ascending thoracic aorta measuring 38 to 40 mm by 2D echo/CT.  He did also have a coronary calcium score performed 10/19/2020 which was 627 all of which was in the LAD.  His lipid profile performed 04/29/2021 revealed an LDL of 70.  He is not on statin therapy.     No outpatient medications have been marked as taking for  the 12/06/21 encounter (Office Visit) with Runell Gess, MD.     Allergies  Allergen Reactions   Neosporin [Neomycin-Polymyxin-Gramicidin] Rash    Social History   Socioeconomic History   Marital status: Married    Spouse name: Not on file   Number of children: 2   Years of education: Not on file   Highest education level: Not on file  Occupational History   Not on file  Tobacco Use   Smoking status: Never   Smokeless tobacco: Never  Substance and Sexual Activity   Alcohol use: Yes    Alcohol/week: 1.0 standard drink of alcohol    Types: 1 Standard drinks or equivalent per week   Drug use: No   Sexual activity: Not on file  Other Topics Concern   Not on file  Social History Narrative   Not on file   Social Determinants of Health   Financial Resource Strain: Not on file  Food Insecurity: Not on file  Transportation Needs: Not on file  Physical Activity: Not on file  Stress: Not on file  Social Connections: Not on file  Intimate Partner Violence: Not on file     Review of Systems: General: negative for chills, fever, night sweats or weight changes.  Cardiovascular: negative for chest pain, dyspnea on exertion, edema, orthopnea, palpitations, paroxysmal nocturnal dyspnea or shortness of breath Dermatological: negative for rash Respiratory: negative for cough or wheezing Urologic:  negative for hematuria Abdominal: negative for nausea, vomiting, diarrhea, bright red blood per rectum, melena, or hematemesis Neurologic: negative for visual changes, syncope, or dizziness All other systems reviewed and are otherwise negative except as noted above.    Blood pressure 124/80, pulse (!) 48, height 5\' 10"  (1.778 m), weight 174 lb (78.9 kg).  General appearance: alert and no distress Neck: no adenopathy, no carotid bruit, no JVD, supple, symmetrical, trachea midline, and thyroid not enlarged, symmetric, no tenderness/mass/nodules Lungs: clear to auscultation  bilaterally Heart: regular rate and rhythm, S1, S2 normal, no murmur, click, rub or gallop Extremities: extremities normal, atraumatic, no cyanosis or edema Pulses: 2+ and symmetric Skin: Skin color, texture, turgor normal. No rashes or lesions Neurologic: Grossly normal  EKG sinus bradycardia 48 without ST or T wave changes.  I personally reviewed this EKG.  ASSESSMENT AND PLAN:   Syncope No further episodes since 2018  Thoracic aortic aneurysm (HCC) Measuring 38 to 40 mm by 2D echo/chest CT  Slow heart rate Heart rate in the 40s, probably physiologic from his being in excellent shape, walking and jogging 50 miles a week  Coronary artery calcification seen on CAT scan Elevated coronary calcium score of 627 performed 10/19/2020.  All the calcium was in the LAD.  He is completely asymptomatic and exercises on a daily basis.  His most recent lipid profile performed 04/29/2021 revealed total cholesterol 54, LDL 70 and HDL of 72.     05/01/2021 MD FACP,FACC,FAHA, [facility] 12/06/2021 10:49 AM

## 2021-12-06 NOTE — Assessment & Plan Note (Signed)
No further episodes since 2018

## 2021-12-06 NOTE — Assessment & Plan Note (Signed)
Measuring 38 to 40 mm by 2D echo/chest CT

## 2021-12-06 NOTE — Assessment & Plan Note (Signed)
Heart rate in the 40s, probably physiologic from his being in excellent shape, walking and jogging 50 miles a week

## 2021-12-06 NOTE — Patient Instructions (Signed)
Medication Instructions:   -Start aspirin (EC) 81mg  once daily.  *If you need a refill on your cardiac medications before your next appointment, please call your pharmacy*   Follow-Up: At [facility], you and your health needs are our priority.  As part of our continuing mission to provide you with exceptional heart care, we have created designated Provider Care Teams.  These Care Teams include your primary Cardiologist (physician) and Advanced Practice Providers (APPs -  Physician Assistants and Nurse Practitioners) who all work together to provide you with the care you need, when you need it.  We recommend signing up for the patient portal called "MyChart".  Sign up information is provided on this After Visit Summary.  MyChart is used to connect with patients for Virtual Visits (Telemedicine).  Patients are able to view lab/test results, encounter notes, upcoming appointments, etc.  Non-urgent messages can be sent to your provider as well.   To learn more about what you can do with MyChart, go to [facility].    Your next appointment:   12 month(s)  The format for your next appointment:   In Person  Provider:   [email or web address], MD

## 2021-12-17 ENCOUNTER — Ambulatory Visit: Payer: Medicare PPO | Admitting: Cardiovascular Disease

## 2022-02-21 DIAGNOSIS — N5201 Erectile dysfunction due to arterial insufficiency: Secondary | ICD-10-CM | POA: Diagnosis not present

## 2022-02-21 DIAGNOSIS — R3915 Urgency of urination: Secondary | ICD-10-CM | POA: Diagnosis not present

## 2022-02-21 DIAGNOSIS — R972 Elevated prostate specific antigen [PSA]: Secondary | ICD-10-CM | POA: Diagnosis not present

## 2022-02-21 DIAGNOSIS — N401 Enlarged prostate with lower urinary tract symptoms: Secondary | ICD-10-CM | POA: Diagnosis not present

## 2022-05-08 DIAGNOSIS — N400 Enlarged prostate without lower urinary tract symptoms: Secondary | ICD-10-CM | POA: Diagnosis not present

## 2022-05-08 DIAGNOSIS — Z0000 Encounter for general adult medical examination without abnormal findings: Secondary | ICD-10-CM | POA: Diagnosis not present

## 2022-05-08 DIAGNOSIS — I2510 Atherosclerotic heart disease of native coronary artery without angina pectoris: Secondary | ICD-10-CM | POA: Diagnosis not present

## 2022-05-08 DIAGNOSIS — Z23 Encounter for immunization: Secondary | ICD-10-CM | POA: Diagnosis not present

## 2022-05-08 DIAGNOSIS — I471 Supraventricular tachycardia, unspecified: Secondary | ICD-10-CM | POA: Diagnosis not present

## 2022-05-08 DIAGNOSIS — I712 Thoracic aortic aneurysm, without rupture, unspecified: Secondary | ICD-10-CM | POA: Diagnosis not present

## 2022-05-11 DIAGNOSIS — Z23 Encounter for immunization: Secondary | ICD-10-CM | POA: Diagnosis not present

## 2022-08-08 ENCOUNTER — Ambulatory Visit (INDEPENDENT_AMBULATORY_CARE_PROVIDER_SITE_OTHER): Payer: Medicare PPO

## 2022-08-08 ENCOUNTER — Ambulatory Visit: Payer: Medicare PPO | Admitting: Podiatry

## 2022-08-08 ENCOUNTER — Encounter: Payer: Self-pay | Admitting: Podiatry

## 2022-08-08 DIAGNOSIS — M7989 Other specified soft tissue disorders: Secondary | ICD-10-CM | POA: Diagnosis not present

## 2022-08-08 DIAGNOSIS — S90111A Contusion of right great toe without damage to nail, initial encounter: Secondary | ICD-10-CM

## 2022-08-08 NOTE — Progress Notes (Signed)
  Subjective:  Patient ID: Warren Brown, male    DOB: 06-Apr-1951,   MRN: [id number]  Chief Complaint  Patient presents with   Toe Injury    Right great toe injury     72 y.o. 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 Relate [place] in the great toe. States he has been icing and taking advil. States very tender. Here today with his wife. He is very active and doe 25000 steps a day.  . Denies any other pedal complaints. Denies n/v/f/c.   Past Medical History:  Diagnosis Date   Hernia    Sinus bradycardia    Syncope    Wears glasses     Objective:  Physical Exam: Vascular: DP/PT pulses 2/4 bilateral. CFT <3 seconds. Normal hair growth on digits. No edema.  Skin. No lacerations or abrasions bilateral feet.  Musculoskeletal: MMT 5/5 bilateral lower extremities in DF, PF, Inversion and Eversion. Deceased ROM in DF of ankle joint. Tender to palpation around right hallux IPJ. Pain with ROM of the joint. No pain around MPJ. No pain around lateral IPJ and toenail.  Neurological: Sensation intact to light touch.   Assessment:   1. Contusion of right great toe without damage to nail, initial encounter      Plan:  Patient was evaluated and treated and all questions answered. -Xrays reviewed. No acute fractures or dislocations. Bipartite sesamoid fibular noted.  -Discussed treatement options for toe contusion; risks, alternatives, and benefits explained. -Discussed wearing stiff soled tennis shoes.  -Recommend protection, rest, ice, elevation daily until symptoms improve -Rx pain med/antinflammatories as needed -Patient to return to office as needed.    Lorenda Peck, DPM

## 2022-08-27 ENCOUNTER — Ambulatory Visit: Payer: Medicare PPO | Admitting: Dermatology

## 2022-08-27 ENCOUNTER — Other Ambulatory Visit: Payer: Self-pay

## 2022-08-27 ENCOUNTER — Encounter: Payer: Self-pay | Admitting: Dermatology

## 2022-08-27 DIAGNOSIS — Z1283 Encounter for screening for malignant neoplasm of skin: Secondary | ICD-10-CM

## 2022-08-27 DIAGNOSIS — L570 Actinic keratosis: Secondary | ICD-10-CM | POA: Diagnosis not present

## 2022-08-27 DIAGNOSIS — D485 Neoplasm of uncertain behavior of skin: Secondary | ICD-10-CM | POA: Diagnosis not present

## 2022-08-27 DIAGNOSIS — L720 Epidermal cyst: Secondary | ICD-10-CM

## 2022-08-27 DIAGNOSIS — D1801 Hemangioma of skin and subcutaneous tissue: Secondary | ICD-10-CM | POA: Diagnosis not present

## 2022-08-27 DIAGNOSIS — B079 Viral wart, unspecified: Secondary | ICD-10-CM

## 2022-08-27 DIAGNOSIS — L821 Other seborrheic keratosis: Secondary | ICD-10-CM | POA: Diagnosis not present

## 2022-08-27 DIAGNOSIS — Z808 Family history of malignant neoplasm of other organs or systems: Secondary | ICD-10-CM

## 2022-08-27 DIAGNOSIS — D489 Neoplasm of uncertain behavior, unspecified: Secondary | ICD-10-CM

## 2022-08-27 DIAGNOSIS — L814 Other melanin hyperpigmentation: Secondary | ICD-10-CM | POA: Diagnosis not present

## 2022-08-27 NOTE — Patient Instructions (Addendum)
Patient Handout: Wound Care for Skin Biopsy Site  Patient Handout: Wound Care for Skin Biopsy Site  Taking Care of Your Skin Biopsy Site  Proper care of the biopsy site is essential for promoting healing and minimizing scarring. This handout provides instructions on how to care for your biopsy site to ensure optimal recovery.  1. Cleaning the Wound:  Clean the biopsy site daily with gentle soap and water. Gently pat the area dry with a clean, soft towel. Avoid harsh scrubbing or rubbing the area, as this can irritate the skin and delay healing.  2. Applying Aquaphor and Bandage:  After cleaning the wound, apply a thin layer of Aquaphor ointment to the biopsy site. Cover the area with a sterile bandage to protect it from dirt, bacteria, and friction. Change the bandage daily or as needed if it becomes soiled or wet.  3. Continued Care for One Week:  Repeat the cleaning, Aquaphor application, and bandaging process daily for one week following the biopsy procedure. Keeping the wound clean and moist during this initial healing period will help prevent infection and promote optimal healing.  4. Massaging Aquaphor into the Area:  ---After one week, discontinue the use of bandages but continue to apply Aquaphor to the biopsy site. ----Gently massage the Aquaphor into the area using circular motions. ---Massaging the skin helps to promote circulation and prevent the formation of scar tissue.   Additional Tips:  Avoid exposing the biopsy site to direct sunlight during the healing process, as this can cause hyperpigmentation or worsen scarring. If you experience any signs of infection, such as increased redness, swelling, warmth, or drainage from the wound, contact your healthcare provider immediately. Follow any additional instructions provided by your healthcare provider for caring for the biopsy site and managing any discomfort. Conclusion:  Taking proper care of your skin biopsy site  is crucial for ensuring optimal healing and minimizing scarring. By following these instructions for cleaning, applying Aquaphor, and massaging the area, you can promote a smooth and successful recovery. If you have any questions or concerns about caring for your biopsy site, don't hesitate to contact your healthcare provider for guidance.     Due to recent changes in healthcare laws, you may see results of your pathology and/or laboratory studies on MyChart before the doctors have had a chance to review them. We understand that in some cases there may be results that are confusing or concerning to you. Please understand that not all results are received at the same time and often the doctors may need to interpret multiple results in order to provide you with the best plan of care or course of treatment. Therefore, we ask that you please give [place] 2 business days to thoroughly review all your results before contacting the office for clarification. Should we see a critical lab result, you will be contacted sooner.   If You Need Anything After Your Visit  If you have any questions or concerns for your doctor, please call our main line at [phone] If no one answers, please leave a voicemail as directed and we will return your call as soon as possible. Messages left after 4 pm will be answered the following business day.   You may also send [place] a message via [place]. We typically respond to MyChart messages within 1-2 business days.  For prescription refills, please ask your pharmacy to contact our office. Our fax number is [phone].  If you have an urgent issue when the clinic is closed  that cannot wait until the next business day, you can page your doctor at the number below.    Please note that while we do our best to be available for urgent issues outside of office hours, we are not available 24/7.   If you have an urgent issue and are unable to reach [place], you may choose to seek medical care at  your doctor's office, retail clinic, urgent care center, or emergency room.  If you have a medical emergency, please immediately call 911 or go to the emergency department. In the event of inclement weather, please call our main line at [phone] for an update on the status of any delays or closures.  Dermatology Medication Tips: Please keep the boxes that topical medications come in in order to help keep track of the instructions about where and how to use these. Pharmacies typically print the medication instructions only on the boxes and not directly on the medication tubes.   If your medication is too expensive, please contact our office at [phone] or send [place] a message through [place].   We are unable to tell what your co-pay for medications will be in advance as this is different depending on your insurance coverage. However, we may be able to find a substitute medication at lower cost or fill out paperwork to get insurance to cover a needed medication.   If a prior authorization is required to get your medication covered by your insurance company, please allow [place] 1-2 business days to complete this process.  Drug prices often vary depending on where the prescription is filled and some pharmacies may offer cheaper prices.  The website www.goodrx.com contains coupons for medications through different pharmacies. The prices here do not account for what the cost may be with help from insurance (it may be cheaper with your insurance), but the website can give you the price if you did not use any insurance.  - You can print the associated coupon and take it with your prescription to the pharmacy.  - You may also stop by our office during regular business hours and pick up a GoodRx coupon card.  - If you need your prescription sent electronically to a different pharmacy, notify our office through [facility] or by phone at [phone]

## 2022-08-27 NOTE — Progress Notes (Unsigned)
   New Patient Visit  Subjective  Warren Brown is a 72 y.o. male who presents for the following: Lesions (Skin lesion for 30 years. Does not go away. Family hx of skin cancer. Sometimes it will get bigger then smaller. Bleeds sometimes when it gets picked at ).  Accompanied by wife  Objective  Well appearing patient in no apparent distress; mood and affect are within normal limits.  All skin waist up examined.  A dermatoscope was used during the exam.  The following people were also present during my examination: , my medical assistant (male)   Right Temple Verrucous papule   Back Brown waxy "stuck on" papules   Assessment & Plan  Neoplasm of uncertain behavior Right Temple  Skin / nail biopsy Type of biopsy: tangential   Informed consent: discussed and consent obtained   Timeout: patient name, date of birth, surgical site, and procedure verified   Procedure prep:  Patient was prepped and draped in usual sterile fashion Prep type:  Isopropyl alcohol Anesthesia: the lesion was anesthetized in a standard fashion   Anesthetic:  1% lidocaine w/ epinephrine 1-100,000 buffered w/ 8.4% NaHCO3 Instrument used: DermaBlade   Outcome: patient tolerated procedure well   Post-procedure details: sterile dressing applied and wound care instructions given   Dressing type: petrolatum and bandage    Specimen 1 - Surgical pathology Differential Diagnosis: ISK vs Wart  Check Margins: No  Plan: Counseling I counseled the patient regarding the following: Instructions: Neoplasms of Uncertain Behavior can be observed, biopsied or surgically removed depending on the level of clinical suspicion.   Seborrheic keratosis Back  I counseled the patient regarding the following: Skin Care: Seborrheic Keratoses are benign. No treatment is necessary. Expectations: Seborrheic Keratoses are benign warty growths. Patients get more of them as they age.   Lentigines - Scattered tan macules -  Due to sun exposure - Benign-appearing, observe - Recommend daily broad spectrum sunscreen SPF 30+ to sun-exposed areas, reapply every 2 hours as needed. - Call for any changes  Seborrheic Keratoses - Stuck-on, waxy, tan-brown papules and/or plaques  - Benign-appearing - Discussed benign etiology and prognosis. - Observe - Call for any changes  Melanocytic Nevi - Tan-brown and/or pink-flesh-colored symmetric macules and papules - Benign appearing on exam today - Observation - Call clinic for new or changing moles - Recommend daily use of broad spectrum spf 30+ sunscreen to sun-exposed areas.   Hemangiomas - Red papules - Discussed benign nature - Observe - Call for any changes  Actinic Damage - Chronic condition, secondary to cumulative UV/sun exposure - diffuse scaly erythematous macules with underlying dyspigmentation - Recommend daily broad spectrum sunscreen SPF 30+ to sun-exposed areas, reapply every 2 hours as needed.  - Staying in the shade or wearing long sleeves, sun glasses (UVA+UVB protection) and wide brim hats (4-inch brim around the entire circumference of the hat) are also recommended for sun protection.  - Call for new or changing lesions.  Skin cancer screening performed today.  Return in about 1 year (around 08/27/2023) for skin check.  Documentation: I have reviewed the above documentation for accuracy and completeness, and I agree with the above  [place], DO

## 2022-08-28 ENCOUNTER — Encounter: Payer: Self-pay | Admitting: Dermatology

## 2022-10-14 ENCOUNTER — Other Ambulatory Visit: Payer: Self-pay | Admitting: Gastroenterology

## 2022-10-14 DIAGNOSIS — R7989 Other specified abnormal findings of blood chemistry: Secondary | ICD-10-CM

## 2022-11-24 ENCOUNTER — Ambulatory Visit
Admission: RE | Admit: 2022-11-24 | Discharge: 2022-11-24 | Disposition: A | Discharge status: Home / Self Care | Payer: Medicare PPO | Source: Ambulatory Visit | Attending: Gastroenterology | Admitting: Gastroenterology

## 2022-11-24 DIAGNOSIS — K7689 Other specified diseases of liver: Secondary | ICD-10-CM | POA: Diagnosis not present

## 2022-11-24 DIAGNOSIS — R7989 Other specified abnormal findings of blood chemistry: Secondary | ICD-10-CM

## 2023-01-23 DIAGNOSIS — R932 Abnormal findings on diagnostic imaging of liver and biliary tract: Secondary | ICD-10-CM | POA: Diagnosis not present

## 2023-02-10 ENCOUNTER — Encounter: Payer: Self-pay | Admitting: Cardiovascular Disease

## 2023-02-10 ENCOUNTER — Ambulatory Visit: Payer: Medicare PPO | Attending: Cardiovascular Disease | Admitting: Cardiovascular Disease

## 2023-02-10 VITALS — BP 138/72 | HR 54 | Ht 70.0 in | Wt 177.0 lb

## 2023-02-10 DIAGNOSIS — R001 Bradycardia, unspecified: Secondary | ICD-10-CM

## 2023-02-10 DIAGNOSIS — I7121 Aneurysm of the ascending aorta, without rupture: Secondary | ICD-10-CM | POA: Diagnosis not present

## 2023-02-10 DIAGNOSIS — I712 Thoracic aortic aneurysm, without rupture, unspecified: Secondary | ICD-10-CM | POA: Diagnosis not present

## 2023-02-10 DIAGNOSIS — I2510 Atherosclerotic heart disease of native coronary artery without angina pectoris: Secondary | ICD-10-CM

## 2023-02-10 DIAGNOSIS — R55 Syncope and collapse: Secondary | ICD-10-CM | POA: Diagnosis not present

## 2023-02-10 LAB — BASIC METABOLIC PANEL
BUN/Creatinine Ratio: 15 (ref 10–24)
BUN: 16 mg/dL (ref 8–27)
CO2: 26 mmol/L (ref 20–29)
Calcium: 9.1 mg/dL (ref 8.6–10.2)
Chloride: 106 mmol/L (ref 96–106)
Creatinine, Ser: 1.09 mg/dL (ref 0.76–1.27)
Glucose: 60 mg/dL — ABNORMAL LOW (ref 70–99)
Potassium: 4.7 mmol/L (ref 3.5–5.2)
Sodium: 142 mmol/L (ref 134–144)
eGFR: 71 mL/min/{1.73_m2} (ref >59)

## 2023-02-10 NOTE — Assessment & Plan Note (Signed)
Sinus bradycardia with a heart rate of 54 probably related to being physically fit.

## 2023-02-10 NOTE — Assessment & Plan Note (Signed)
History of elevated coronary calcium score of 627 which was principally in the LAD territory measured 10/19/2020.  He is at goal for secondary prevention with regards to his lipid profile.  He is very active and is asymptomatic.

## 2023-02-10 NOTE — Progress Notes (Signed)
02/10/2023 Warren Brown   1951-01-21  [id number]  Primary Physician Joycelyn Rua, MD Primary Cardiologist: Runell Gess MD Nicholes Calamity, [place]  HPI:  Warren Brown is a 72 y.o.    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 He was referred by Dr. Foy Guadalajara at [facility] for cardiovascular evaluation because of an episode of syncope. I last saw him in the office 12/06/2021.  He has no coronary vascular risk factors. He had his presyncopal episodes in quick succession in [place] on 01/15/15. This occurred after driving 12 hours from [place] to [place] without food or water. He did drink 4 beers that evening and had 3 sequential episodes of syncope. There is no loss of bowel or bladder control. When EMS arrived his blood pressure was noted to be 90/50. Recent blood work performed 2 days afterwards revealed a serum creatinine 1.75 which was significantly higher than his baseline of 1. He denies chest pain or shortness of breath. He recently had a syncopal episode on 09/16/16 which was probably vagal. It was preceded by nausea vomiting and abdominal pain. His EKG in the ER showed sinus bradycardia. He did fall and hit his head and was worked up for this. He is recovering from a concussion.   He has mild dilatation of his ascending thoracic aorta measuring 38 to 40 mm by 2D echo/CT.  He did also have a coronary calcium score performed 10/19/2020 which was 627 all of which was in the LAD.  His lipid profile performed 05/08/2022 revealed a total cholesterol of 153, LDL 68 and HDL of 72.  Since I saw him a year ago he has remained stable.  He continues to walk/run 50 miles a week and is active in his yard.  He is completely asymptomatic.  Current Meds  Medication Sig   aspirin EC 81 MG tablet Take 1 tablet (81 mg total) by mouth daily. Swallow whole.   Omega-3 Fatty Acids (FISH OIL)  1000 MG CAPS Take 1 capsule by mouth daily at 6 (six) AM.   sildenafil (REVATIO) 20 MG tablet Take 20 mg by mouth as needed.   VITAMIN D, CHOLECALCIFEROL, PO Take 1 tablet by mouth daily at 6 (six) AM.     Allergies  Allergen Reactions   Neosporin [Neomycin-Polymyxin-Gramicidin] Rash    Social History   Socioeconomic History   Marital status: Married    Spouse name: Not on file   Number of children: 2   Years of education: Not on file   Highest education level: Not on file  Occupational History   Not on file  Tobacco Use   Smoking status: Never   Smokeless tobacco: Never  Substance and Sexual Activity   Alcohol use: Yes    Alcohol/week: 1.0 standard drink of alcohol    Types: 1 Standard drinks or equivalent per week   Drug use: No   Sexual activity: Not on file  Other Topics Concern   Not on file  Social History Narrative   Not on file   Social Determinants of Health   Financial Resource Strain: Not on file  Food Insecurity: Not on file  Transportation Needs: Not on file  Physical Activity: Not on file  Stress: Not on file  Social Connections: Not on file  Intimate Partner Violence: Not on file     Review of Systems: General: negative for chills, fever, night sweats  or weight changes.  Cardiovascular: negative for chest pain, dyspnea on exertion, edema, orthopnea, palpitations, paroxysmal nocturnal dyspnea or shortness of breath Dermatological: negative for rash Respiratory: negative for cough or wheezing Urologic: negative for hematuria Abdominal: negative for nausea, vomiting, diarrhea, bright red blood per rectum, melena, or hematemesis Neurologic: negative for visual changes, syncope, or dizziness All other systems reviewed and are otherwise negative except as noted above.    Blood pressure 138/72, pulse (!) 54, height 5\' 10"  (1.778 m), weight 177 lb (80.3 kg), SpO2 98%.  General appearance: alert and no distress Neck: no adenopathy, no carotid bruit, no  JVD, supple, symmetrical, trachea midline, and thyroid not enlarged, symmetric, no tenderness/mass/nodules Lungs: clear to auscultation bilaterally Heart: Regular rate and rhythm without murmurs gallops rubs or clicks Extremities: extremities normal, atraumatic, no cyanosis or edema Pulses: 2+ and symmetric Skin: Skin color, texture, turgor normal. No rashes or lesions Neurologic: Grossly normal  EKG EKG Interpretation Date/Time:  Tuesday February 10 2023 08:07:24 EDT Ventricular Rate:  54 PR Interval:  164 QRS Duration:  96 QT Interval:  422 QTC Calculation: 400 R Axis:   23  Text Interpretation: Sinus bradycardia When compared with ECG of 16-Sep-2016 19:56, PREVIOUS ECG IS PRESENT Confirmed by Nanetta Batty [phone]) on 02/10/2023 8:09:32 AM    ASSESSMENT AND PLAN:   Syncope History of syncope in the past but none since his last episode in 2018.  Thoracic aortic aneurysm (HCC) History of small thoracic aortic aneurysm measuring 38 mm by CTA 28/21 and 40 mm by 2D echo in 2018.  Will recheck a chest CTA.  Slow heart rate Sinus bradycardia with a heart rate of 54 probably related to being physically fit.  Coronary artery calcification seen on CAT scan History of elevated coronary calcium score of 627 which was principally in the LAD territory measured 10/19/2020.  He is at goal for secondary prevention with regards to his lipid profile.  He is very active and is asymptomatic.     Runell Gess MD FACP,FACC,FAHA, [facility] 02/10/2023 8:22 AM

## 2023-02-10 NOTE — Assessment & Plan Note (Signed)
History of small thoracic aortic aneurysm measuring 38 mm by CTA 28/21 and 40 mm by 2D echo in 2018.  Will recheck a chest CTA.

## 2023-02-10 NOTE — Patient Instructions (Addendum)
Medication Instructions:  Your physician recommends that you continue on your current medications as directed. Please refer to the Current Medication list given to you today.  *If you need a refill on your cardiac medications before your next appointment, please call your pharmacy*   Lab Work: Your physician recommends that you have labs drawn today: BMET  If you have labs (blood work) drawn today and your tests are completely normal, you will receive your results only by: MyChart Message (if you have MyChart) OR A paper copy in the mail If you have any lab test that is abnormal or we need to change your treatment, we will call you to review the results.   Testing/Procedures: Non-Cardiac CT Angiography (CTA) chest/aorta, is a special type of CT scan that uses a computer to produce multi-dimensional views of major blood vessels throughout the body. In CT angiography, a contrast material is injected through an IV to help visualize the blood vessels.     Follow-Up: At [facility], you and your health needs are our priority.  As part of our continuing mission to provide you with exceptional heart care, we have created designated Provider Care Teams.  These Care Teams include your primary Cardiologist (physician) and Advanced Practice Providers (APPs -  Physician Assistants and Nurse Practitioners) who all work together to provide you with the care you need, when you need it.  We recommend signing up for the patient portal called "MyChart".  Sign up information is provided on this After Visit Summary.  MyChart is used to connect with patients for Virtual Visits (Telemedicine).  Patients are able to view lab/test results, encounter notes, upcoming appointments, etc.  Non-urgent messages can be sent to your provider as well.   To learn more about what you can do with MyChart, go to [email or web address].    Your next appointment:   12 month(s)  Provider:   Nanetta Batty, MD

## 2023-02-10 NOTE — Assessment & Plan Note (Signed)
History of syncope in the past but none since his last episode in 2018.

## 2023-03-25 DIAGNOSIS — N5201 Erectile dysfunction due to arterial insufficiency: Secondary | ICD-10-CM | POA: Diagnosis not present

## 2023-03-25 DIAGNOSIS — N401 Enlarged prostate with lower urinary tract symptoms: Secondary | ICD-10-CM | POA: Diagnosis not present

## 2023-03-25 DIAGNOSIS — R972 Elevated prostate specific antigen [PSA]: Secondary | ICD-10-CM | POA: Diagnosis not present

## 2023-03-25 DIAGNOSIS — R3915 Urgency of urination: Secondary | ICD-10-CM | POA: Diagnosis not present

## 2023-03-31 ENCOUNTER — Other Ambulatory Visit: Payer: Self-pay | Admitting: Urology

## 2023-03-31 DIAGNOSIS — R3915 Urgency of urination: Secondary | ICD-10-CM

## 2023-03-31 DIAGNOSIS — N401 Enlarged prostate with lower urinary tract symptoms: Secondary | ICD-10-CM

## 2023-03-31 DIAGNOSIS — R972 Elevated prostate specific antigen [PSA]: Secondary | ICD-10-CM

## 2023-04-13 ENCOUNTER — Other Ambulatory Visit: Payer: Self-pay | Admitting: Gastroenterology

## 2023-04-13 DIAGNOSIS — R7989 Other specified abnormal findings of blood chemistry: Secondary | ICD-10-CM

## 2023-04-27 ENCOUNTER — Ambulatory Visit (HOSPITAL_BASED_OUTPATIENT_CLINIC_OR_DEPARTMENT_OTHER)
Admission: RE | Admit: 2023-04-27 | Discharge: 2023-04-27 | Disposition: A | Discharge status: Home / Self Care | Payer: Medicare PPO | Source: Ambulatory Visit | Attending: Cardiovascular Disease | Admitting: Cardiovascular Disease

## 2023-04-27 DIAGNOSIS — K7689 Other specified diseases of liver: Secondary | ICD-10-CM | POA: Diagnosis not present

## 2023-04-27 DIAGNOSIS — I700 Atherosclerosis of aorta: Secondary | ICD-10-CM | POA: Diagnosis not present

## 2023-04-27 DIAGNOSIS — I2510 Atherosclerotic heart disease of native coronary artery without angina pectoris: Secondary | ICD-10-CM | POA: Diagnosis not present

## 2023-04-27 DIAGNOSIS — I7121 Aneurysm of the ascending aorta, without rupture: Secondary | ICD-10-CM | POA: Insufficient documentation

## 2023-04-27 DIAGNOSIS — I712 Thoracic aortic aneurysm, without rupture, unspecified: Secondary | ICD-10-CM | POA: Insufficient documentation

## 2023-04-27 DIAGNOSIS — I77810 Thoracic aortic ectasia: Secondary | ICD-10-CM | POA: Diagnosis not present

## 2023-04-27 DIAGNOSIS — R001 Bradycardia, unspecified: Secondary | ICD-10-CM | POA: Insufficient documentation

## 2023-04-27 MED ORDER — IOHEXOL 350 MG/ML SOLN
100.0000 mL | Freq: Once | INTRAVENOUS | Status: AC | PRN
  Administered 2023-04-27: 75 mL via INTRAVENOUS

## 2023-04-30 ENCOUNTER — Encounter: Payer: Self-pay | Admitting: Cardiovascular Disease

## 2023-05-11 DIAGNOSIS — H25013 Cortical age-related cataract, bilateral: Secondary | ICD-10-CM | POA: Diagnosis not present

## 2023-05-11 DIAGNOSIS — H524 Presbyopia: Secondary | ICD-10-CM | POA: Diagnosis not present

## 2023-05-14 DIAGNOSIS — Z23 Encounter for immunization: Secondary | ICD-10-CM | POA: Diagnosis not present

## 2023-05-14 DIAGNOSIS — Z0000 Encounter for general adult medical examination without abnormal findings: Secondary | ICD-10-CM | POA: Diagnosis not present

## 2023-05-14 DIAGNOSIS — R932 Abnormal findings on diagnostic imaging of liver and biliary tract: Secondary | ICD-10-CM | POA: Diagnosis not present

## 2023-05-14 DIAGNOSIS — I712 Thoracic aortic aneurysm, without rupture, unspecified: Secondary | ICD-10-CM | POA: Diagnosis not present

## 2023-05-14 DIAGNOSIS — Z131 Encounter for screening for diabetes mellitus: Secondary | ICD-10-CM | POA: Diagnosis not present

## 2023-05-14 DIAGNOSIS — E559 Vitamin D deficiency, unspecified: Secondary | ICD-10-CM | POA: Diagnosis not present

## 2023-05-14 DIAGNOSIS — R972 Elevated prostate specific antigen [PSA]: Secondary | ICD-10-CM | POA: Diagnosis not present

## 2023-05-14 DIAGNOSIS — I2510 Atherosclerotic heart disease of native coronary artery without angina pectoris: Secondary | ICD-10-CM | POA: Diagnosis not present

## 2023-05-25 ENCOUNTER — Ambulatory Visit
Admission: RE | Admit: 2023-05-25 | Discharge: 2023-05-25 | Disposition: A | Discharge status: Home / Self Care | Payer: Medicare PPO | Source: Ambulatory Visit | Attending: Gastroenterology | Admitting: Gastroenterology

## 2023-05-25 DIAGNOSIS — R160 Hepatomegaly, not elsewhere classified: Secondary | ICD-10-CM | POA: Diagnosis not present

## 2023-05-25 DIAGNOSIS — R7989 Other specified abnormal findings of blood chemistry: Secondary | ICD-10-CM

## 2023-05-26 ENCOUNTER — Other Ambulatory Visit: Payer: Self-pay | Admitting: Gastroenterology

## 2023-05-26 DIAGNOSIS — R932 Abnormal findings on diagnostic imaging of liver and biliary tract: Secondary | ICD-10-CM

## 2023-05-28 ENCOUNTER — Other Ambulatory Visit: Payer: Self-pay | Admitting: Medical Genetics

## 2023-06-18 ENCOUNTER — Other Ambulatory Visit: Payer: Medicare PPO

## 2023-06-19 ENCOUNTER — Ambulatory Visit
Admission: RE | Admit: 2023-06-19 | Discharge: 2023-06-19 | Disposition: A | Discharge status: Home / Self Care | Payer: Medicare PPO | Source: Ambulatory Visit | Attending: Gastroenterology | Admitting: Gastroenterology

## 2023-06-19 DIAGNOSIS — K7689 Other specified diseases of liver: Secondary | ICD-10-CM | POA: Diagnosis not present

## 2023-06-19 DIAGNOSIS — R932 Abnormal findings on diagnostic imaging of liver and biliary tract: Secondary | ICD-10-CM

## 2023-06-19 MED ORDER — GADOPICLENOL 0.5 MMOL/ML IV SOLN
8.0000 mL | Freq: Once | INTRAVENOUS | Status: AC | PRN
  Administered 2023-06-19: 8 mL via INTRAVENOUS

## 2023-06-24 ENCOUNTER — Other Ambulatory Visit (HOSPITAL_COMMUNITY)
Admission: RE | Admit: 2023-06-24 | Discharge: 2023-06-24 | Disposition: A | Discharge status: Home / Self Care | Payer: Self-pay | Source: Ambulatory Visit | Attending: Medical Genetics | Admitting: Medical Genetics

## 2023-07-03 LAB — GENECONNECT MOLECULAR SCREEN: Genetic Analysis Overall Interpretation: NEGATIVE

## 2024-04-13 ENCOUNTER — Ambulatory Visit: Attending: Cardiovascular Disease | Admitting: Cardiovascular Disease

## 2024-04-13 ENCOUNTER — Encounter: Payer: Self-pay | Admitting: Cardiovascular Disease

## 2024-04-13 DIAGNOSIS — I2510 Atherosclerotic heart disease of native coronary artery without angina pectoris: Secondary | ICD-10-CM | POA: Diagnosis not present

## 2024-04-13 DIAGNOSIS — I7121 Aneurysm of the ascending aorta, without rupture: Secondary | ICD-10-CM | POA: Diagnosis not present

## 2024-04-13 DIAGNOSIS — E785 Hyperlipidemia, unspecified: Secondary | ICD-10-CM | POA: Insufficient documentation

## 2024-04-13 DIAGNOSIS — E782 Mixed hyperlipidemia: Secondary | ICD-10-CM | POA: Diagnosis not present

## 2024-04-13 DIAGNOSIS — R931 Abnormal findings on diagnostic imaging of heart and coronary circulation: Secondary | ICD-10-CM | POA: Diagnosis not present

## 2024-04-13 MED ORDER — ATORVASTATIN CALCIUM 20 MG PO TABS: 20.0000 mg | ORAL_TABLET | Freq: Every day | ORAL | 3 refills | Status: AC

## 2024-04-13 NOTE — Progress Notes (Signed)
 04/13/2024 Warren Brown   12-27-1950  [id number]  Primary Physician Warren Senior, MD Primary Cardiologist: Warren [name] MD Warren Brown, FSCAI  HPI:  Warren Brown is a 73 y.o.    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 He was referred by Dr. Brien at [facility] for cardiovascular evaluation because of an episode of syncope.  He is accompanied by his wife Warren Brown today.  I last saw him in the office 02/10/2023.  He has no coronary vascular risk factors. He had his presyncopal episodes in quick succession in [place] on 01/15/15. This occurred after driving 12 hours from [place] to [place] without food or water. He did drink 4 beers that evening and had 3 sequential episodes of syncope. There is no loss of bowel or bladder control. When EMS arrived his blood pressure was noted to be 90/50. Recent blood work performed 2 days afterwards revealed a serum creatinine 1.75 which was significantly higher than his baseline of 1. He denies chest pain or shortness of breath. He recently had a syncopal episode on 09/16/16 which was probably vagal. It was preceded by nausea vomiting and abdominal pain. His EKG in the ER showed sinus bradycardia. He did fall and hit his head and was worked up for this. He is recovering from a concussion.   He has mild dilatation of his ascending thoracic aorta measuring 38 to 40 mm by 2D echo/CT.  He did also have a coronary calcium score performed 10/19/2020 which was 627 all of which was in the LAD.  His lipid profile performed 05/14/2023 revealed total cholesterol 160, LDL 83 and HDL 67.  Since I saw him a year ago he has remained stable.  He continues to walk/run 50 miles a week and is active in his yard.  He is completely asymptomatic.   Current Meds  Medication Sig   aspirin  EC 81 MG tablet Take 1 tablet (81 mg total) by mouth daily. Swallow  whole.   Omega-3 Fatty Acids (FISH OIL) 1000 MG CAPS Take 1 capsule by mouth daily at 6 (six) AM.   sildenafil (REVATIO) 20 MG tablet Take 20 mg by mouth as needed.   VITAMIN D, CHOLECALCIFEROL, PO Take 1 tablet by mouth daily at 6 (six) AM.     Allergies  Allergen Reactions   Neosporin [Neomycin-Polymyxin-Gramicidin] Rash    Social History   Socioeconomic History   Marital status: Married    Spouse name: Not on file   Number of children: 2   Years of education: Not on file   Highest education level: Not on file  Occupational History   Not on file  Tobacco Use   Smoking status: Never   Smokeless tobacco: Never  Substance and Sexual Activity   Alcohol use: Yes    Alcohol/week: 1.0 standard drink of alcohol    Types: 1 Standard drinks or equivalent per week   Drug use: No   Sexual activity: Not on file  Other Topics Concern   Not on file  Social History Narrative   Not on file   Social Drivers of Health   Financial Resource Strain: Not on file  Food Insecurity: Not on file  Transportation Needs: Not on file  Physical Activity: Not on file  Stress: Not on file  Social Connections: Not on file  Intimate Partner Violence: Not on file     Review of  Systems: General: negative for chills, fever, night sweats or weight changes.  Cardiovascular: negative for chest pain, dyspnea on exertion, edema, orthopnea, palpitations, paroxysmal nocturnal dyspnea or shortness of breath Dermatological: negative for rash Respiratory: negative for cough or wheezing Urologic: negative for hematuria Abdominal: negative for nausea, vomiting, diarrhea, bright red blood per rectum, melena, or hematemesis Neurologic: negative for visual changes, syncope, or dizziness All other systems reviewed and are otherwise negative except as noted above.    There were no vitals taken for this visit.  General appearance: alert and no distress Neck: no adenopathy, no carotid bruit, no JVD, supple,  symmetrical, trachea midline, and thyroid not enlarged, symmetric, no tenderness/mass/nodules Lungs: clear to auscultation bilaterally Heart: regular rate and rhythm, S1, S2 normal, no murmur, click, rub or gallop Extremities: extremities normal, atraumatic, no cyanosis or edema Pulses: 2+ and symmetric Skin: Skin color, texture, turgor normal. No rashes or lesions Neurologic: Grossly normal  EKG EKG Interpretation Date/Time:  Wednesday April 13 2024 10:34:34 EST Ventricular Rate:  54 PR Interval:  154 QRS Duration:  94 QT Interval:  420 QTC Calculation: 398 R Axis:   82  Text Interpretation: Sinus bradycardia When compared with ECG of 10-Feb-2023 08:07, Questionable change in QRS axis T wave inversion now evident in Inferior leads Confirmed by Court Carrier [phone]) on 04/13/2024 10:45:21 AM    ASSESSMENT AND PLAN:   Thoracic aortic aneurysm History of small thoracic aortic aneurysm measuring 40 mm by 2D echocardiogram 2018.  I am going to recheck a 2D echocardiogram.  Elevated coronary artery calcium score Coronary calcium score of 627 all of which was in the LAD performed 10/19/2020.  He is completely asymptomatic but not at goal for secondary prevention.  I am going to start him on atorvastatin 20 mg a day and we will recheck a lipid lipid profile in 3 months.  Hyperlipidemia History of mild hyperlipidemia not on statin therapy with lipid profile performed 05/14/2023 revealing a total cholesterol 160, LDL 83 and HDL of 67.  I am going to check a fasting lipid liver profile in the next several weeks and we will begin him on atorvastatin 20 mg a day.  LDL goal less than 70.     Carrier DOROTHA Court MD FACP,FACC,FAHA, [facility] 04/13/2024 11:02 AM

## 2024-04-13 NOTE — Assessment & Plan Note (Signed)
 History of small thoracic aortic aneurysm measuring 40 mm by 2D echocardiogram 2018.  I am going to recheck a 2D echocardiogram.

## 2024-04-13 NOTE — Assessment & Plan Note (Signed)
 History of mild hyperlipidemia not on statin therapy with lipid profile performed 05/14/2023 revealing a total cholesterol 160, LDL 83 and HDL of 67.  I am going to check a fasting lipid liver profile in the next several weeks and we will begin him on atorvastatin 20 mg a day.  LDL goal less than 70.

## 2024-04-13 NOTE — Patient Instructions (Signed)
 Medication Instructions:  Your physician has recommended you make the following change in your medication:   -Start atorvastatin (lipitor) 20mg  once daily.  *If you need a refill on your cardiac medications before your next appointment, please call your pharmacy*  Lab Work: Your physician recommends that you return for lab work in: When echo is scheduled- FASTING lipid/liver panel  Your physician recommends that you return for lab work in: 3 months for FASTING lipid/liver panel    If you have labs (blood work) drawn today and your tests are completely normal, you will receive your results only by: MyChart Message (if you have MyChart) OR A paper copy in the mail If you have any lab test that is abnormal or we need to change your treatment, we will call you to review the results.  Testing/Procedures: Your physician has requested that you have an echocardiogram. Echocardiography is a painless test that uses sound waves to create images of your heart. It provides your doctor with information about the size and shape of your heart and how well your heart's chambers and valves are working. This procedure takes approximately one hour. There are no restrictions for this procedure. Please do NOT wear cologne, perfume, aftershave, or lotions (deodorant is allowed). Please arrive 15 minutes prior to your appointment time.  Please note: We ask at that you not bring children with you during ultrasound (echo/ vascular) testing. Due to room size and safety concerns, children are not allowed in the ultrasound rooms during exams. Our front office staff cannot provide observation of children in our lobby area while testing is being conducted. An adult accompanying a patient to their appointment will only be allowed in the ultrasound room at the discretion of the ultrasound technician under special circumstances. We apologize for any inconvenience.   Follow-Up: At [facility], you and your health  needs are our priority.  As part of our continuing mission to provide you with exceptional heart care, our providers are all part of one team.  This team includes your primary Cardiologist (physician) and Advanced Practice Providers or APPs (Physician Assistants and Nurse Practitioners) who all work together to provide you with the care you need, when you need it.  Your next appointment:   12 month(s)  Provider:   Dorn Lesches, MD

## 2024-04-13 NOTE — Assessment & Plan Note (Signed)
 Coronary calcium score of 627 all of which was in the LAD performed 10/19/2020.  He is completely asymptomatic but not at goal for secondary prevention.  I am going to start him on atorvastatin 20 mg a day and we will recheck a lipid lipid profile in 3 months.

## 2024-04-15 DIAGNOSIS — R399 Unspecified symptoms and signs involving the genitourinary system: Secondary | ICD-10-CM | POA: Diagnosis not present

## 2024-04-18 DIAGNOSIS — N401 Enlarged prostate with lower urinary tract symptoms: Secondary | ICD-10-CM | POA: Diagnosis not present

## 2024-04-18 DIAGNOSIS — R972 Elevated prostate specific antigen [PSA]: Secondary | ICD-10-CM | POA: Diagnosis not present

## 2024-04-18 DIAGNOSIS — R351 Nocturia: Secondary | ICD-10-CM | POA: Diagnosis not present

## 2024-05-10 ENCOUNTER — Ambulatory Visit: Payer: Self-pay | Admitting: Cardiovascular Disease

## 2024-05-10 ENCOUNTER — Ambulatory Visit (HOSPITAL_COMMUNITY)
Admission: RE | Admit: 2024-05-10 | Discharge: 2024-05-10 | Disposition: A | Discharge status: Home / Self Care | Source: Ambulatory Visit | Attending: Cardiovascular Disease | Admitting: Cardiovascular Disease

## 2024-05-10 DIAGNOSIS — I2510 Atherosclerotic heart disease of native coronary artery without angina pectoris: Secondary | ICD-10-CM | POA: Diagnosis not present

## 2024-05-10 DIAGNOSIS — R931 Abnormal findings on diagnostic imaging of heart and coronary circulation: Secondary | ICD-10-CM | POA: Insufficient documentation

## 2024-05-10 DIAGNOSIS — I7121 Aneurysm of the ascending aorta, without rupture: Secondary | ICD-10-CM | POA: Diagnosis not present

## 2024-05-10 DIAGNOSIS — E782 Mixed hyperlipidemia: Secondary | ICD-10-CM | POA: Insufficient documentation

## 2024-05-10 LAB — ECHOCARDIOGRAM COMPLETE
AR max vel: 4.51 cm2
AV Area VTI: 4.58 cm2
AV Area mean vel: 4.37 cm2
AV Mean grad: 2 mmHg
AV Peak grad: 3.2 mmHg
Ao pk vel: 0.9 m/s
Area-P 1/2: 5.23 cm2
MV M vel: 1.67 m/s
MV Peak grad: 11.2 mmHg
P 1/2 time: 889 ms
S' Lateral: 3.2 cm

## 2024-05-11 LAB — HEPATIC FUNCTION PANEL
ALT: 25 IU/L (ref 0–44)
AST: 29 IU/L (ref 0–40)
Albumin: 4.1 g/dL (ref 3.8–4.8)
Alkaline Phosphatase: 161 IU/L — ABNORMAL HIGH (ref 47–123)
Bilirubin Total: 1.4 mg/dL — ABNORMAL HIGH (ref 0.0–1.2)
Bilirubin, Direct: 0.46 mg/dL — ABNORMAL HIGH (ref 0.00–0.40)
Total Protein: 6.7 g/dL (ref 6.0–8.5)

## 2024-05-11 LAB — LIPID PANEL
Chol/HDL Ratio: 1.8 ratio (ref 0.0–5.0)
Cholesterol, Total: 115 mg/dL (ref 100–199)
HDL: 63 mg/dL (ref >39)
LDL Chol Calc (NIH): 41 mg/dL (ref 0–99)
Triglycerides: 45 mg/dL (ref 0–149)
VLDL Cholesterol Cal: 11 mg/dL (ref 5–40)

## 2024-05-18 DIAGNOSIS — I2510 Atherosclerotic heart disease of native coronary artery without angina pectoris: Secondary | ICD-10-CM | POA: Diagnosis not present

## 2024-05-18 DIAGNOSIS — E785 Hyperlipidemia, unspecified: Secondary | ICD-10-CM | POA: Diagnosis not present

## 2024-05-18 DIAGNOSIS — Z125 Encounter for screening for malignant neoplasm of prostate: Secondary | ICD-10-CM | POA: Diagnosis not present

## 2024-05-18 DIAGNOSIS — Z0000 Encounter for general adult medical examination without abnormal findings: Secondary | ICD-10-CM | POA: Diagnosis not present
